# Patient Record
Sex: Female | Born: 1980
Health system: Southern US, Community
[De-identification: ages and names within clinical notes are randomized; demographics above are authoritative.]

## PROBLEM LIST (undated history)

## (undated) DIAGNOSIS — F419 Anxiety disorder, unspecified: Secondary | ICD-10-CM

## (undated) HISTORY — PX: WISDOM TOOTH EXTRACTION: SHX21

## (undated) HISTORY — DX: Anxiety disorder, unspecified: F41.9

---

## 2009-06-15 HISTORY — PX: APPENDECTOMY: SHX54

## 2012-06-15 LAB — HM PAP SMEAR: HM Pap smear: NORMAL

## 2014-10-09 ENCOUNTER — Encounter: Payer: Self-pay | Admitting: *Deleted

## 2014-10-09 ENCOUNTER — Telehealth: Payer: Self-pay | Admitting: *Deleted

## 2014-10-09 NOTE — Telephone Encounter (Signed)
Pre-Visit Call completed with patient and chart updated.   Pre-Visit Info documented in Specialty Comments under [employer].    

## 2014-10-10 ENCOUNTER — Encounter: Payer: Self-pay | Admitting: Physician Assistant

## 2014-10-10 ENCOUNTER — Ambulatory Visit (INDEPENDENT_AMBULATORY_CARE_PROVIDER_SITE_OTHER): Payer: 59 | Admitting: Physician Assistant

## 2014-10-10 VITALS — BP 106/62 | HR 72 | Temp 98.5°F | Resp 16 | Ht 64.0 in | Wt 143.0 lb

## 2014-10-10 DIAGNOSIS — K5900 Constipation, unspecified: Secondary | ICD-10-CM

## 2014-10-10 DIAGNOSIS — F329 Major depressive disorder, single episode, unspecified: Secondary | ICD-10-CM | POA: Insufficient documentation

## 2014-10-10 DIAGNOSIS — F411 Generalized anxiety disorder: Secondary | ICD-10-CM | POA: Diagnosis not present

## 2014-10-10 DIAGNOSIS — F32A Depression, unspecified: Secondary | ICD-10-CM | POA: Insufficient documentation

## 2014-10-10 DIAGNOSIS — K5904 Chronic idiopathic constipation: Secondary | ICD-10-CM

## 2014-10-10 DIAGNOSIS — F419 Anxiety disorder, unspecified: Secondary | ICD-10-CM

## 2014-10-10 MED ORDER — CITALOPRAM HYDROBROMIDE 10 MG PO TABS: 10.0000 mg | ORAL_TABLET | Freq: Every day | ORAL | Status: DC

## 2014-10-10 MED ORDER — LORAZEPAM 0.5 MG PO TABS: 0.5000 mg | ORAL_TABLET | Freq: Two times a day (BID) | ORAL | Status: DC

## 2014-10-10 MED ORDER — LINACLOTIDE 145 MCG PO CAPS: 145.0000 ug | ORAL_CAPSULE | Freq: Every day | ORAL | Status: DC

## 2014-10-10 NOTE — Patient Instructions (Signed)
Please continue your medications as directed. Stay well hydrated and continue Probiotic. Take the Linzess as directed (using Voucher given) for chronic constipation. Follow-up in 1 month.

## 2014-10-10 NOTE — Assessment & Plan Note (Signed)
Doing well overall.  Medications refilled.  Will follow-up in 3 months.

## 2014-10-10 NOTE — Assessment & Plan Note (Addendum)
Patient has tried probiotics, fiber, hydration, exercise and OTC laxatives with only minimal improvement. Will begin trial of Linzess. Follow-up in 1 month.

## 2014-10-10 NOTE — Progress Notes (Signed)
   Patient presents to clinic today to establish care.  Acute Concerns: No acute concerns today.  Chronic Issues: Anxiety --  Currently on Citalopram 10 mg and Ativan BID. Endorses doing well overall.  Denies SI/HI or panic attack.  Denies depressed mood.  Chronic Constipation -- Endorses history of chronic constipation since youth.  Endorses BM every 4-5 days.  Is taking a probiotic and high fiber/water diet without improvement in symptoms.  Denies tenesmus, melena, or hematochezia.  Past Medical History  Diagnosis Date  . Anxiety     Past Surgical History  Procedure Laterality Date  . Appendectomy  2011  . Wisdom tooth extraction      Current Outpatient Prescriptions on File Prior to Visit  Medication Sig Dispense Refill  . Multiple Vitamins-Minerals (MULTIVITAMIN ADULT PO) Take 1 tablet by mouth daily. Women's Health    . Probiotic Product (PROBIOTIC DAILY PO) Take 2 capsules by mouth daily.     No current facility-administered medications on file prior to visit.    Allergies  Allergen Reactions  . Latex Rash    Family History  Problem Relation Age of Onset  . Fibromyalgia Father   . Hypertension Maternal Grandmother     History   Social History  . Marital Status: Married    Spouse Name: N/A  . Number of Children: N/A  . Years of Education: N/A   Occupational History  . Not on file.   Social History Main Topics  . Smoking status: Never Smoker   . Smokeless tobacco: Not on file  . Alcohol Use: 0.0 oz/week    0 Standard drinks or equivalent per week  . Drug Use: No  . Sexual Activity: Not on file   Other Topics Concern  . Not on file   Social History Narrative   ROS Pertinent ROS are listed in the HPI.  BP 106/62 mmHg  Pulse 72  Temp(Src) 98.5 F (36.9 C) (Oral)  Resp 16  Ht 5\' 4"  (1.626 m)  Wt 143 lb (64.864 kg)  BMI 24.53 kg/m2  SpO2 98%  LMP 09/26/2014  Physical Exam  Constitutional: She is oriented to person, place, and time and  well-developed, well-nourished, and in no distress.  HENT:  Head: Normocephalic and atraumatic.  Eyes: Conjunctivae are normal.  Neck: Neck supple.  Cardiovascular: Normal rate, regular rhythm, normal heart sounds and intact distal pulses.   Pulmonary/Chest: Effort normal and breath sounds normal. No respiratory distress. She has no wheezes. She has no rales. She exhibits no tenderness.  Abdominal: Soft. Bowel sounds are normal. She exhibits no distension and no mass. There is no tenderness. There is no rebound and no guarding.  Neurological: She is alert and oriented to person, place, and time.  Skin: Skin is warm and dry. No rash noted.  Psychiatric: Affect normal.     Assessment/Plan: Generalized anxiety disorder Doing well overall.  Medications refilled.  Will follow-up in 3 months.   Chronic idiopathic constipation Patient has tried probiotics, fiber, hydration, exercise and OTC laxatives with only minimal improvement. Will begin trial of Linzess. Follow-up in 1 month.

## 2014-10-10 NOTE — Progress Notes (Signed)
Pre visit review using our clinic review tool, if applicable. No additional management support is needed unless otherwise documented below in the visit note. 

## 2014-10-15 ENCOUNTER — Telehealth: Payer: Self-pay | Admitting: Physician Assistant

## 2014-10-15 NOTE — Telephone Encounter (Signed)
No need to follow-up at present. I will keep a look out for coupons for Amitiza as we previously had free trial vouchers for that medication.  I will call her when we get some more.

## 2014-10-15 NOTE — Telephone Encounter (Signed)
LVM advising pt of PA instructions  °

## 2014-10-15 NOTE — Telephone Encounter (Signed)
Relation to pt: self Call back number: [phone]   Reason for call:  Pt states Linaclotide (LINZESS) 145 MCG CAPS capsule  Is to expensive and was unable to use coupon. Pt also states she has had this problem all her life regarding eating and wanted to know if you still wanted her to follow up 11/07/14 due to her not taking medication. Please advise

## 2014-11-07 ENCOUNTER — Ambulatory Visit (INDEPENDENT_AMBULATORY_CARE_PROVIDER_SITE_OTHER): Payer: 59 | Admitting: Physician Assistant

## 2014-11-07 NOTE — Progress Notes (Signed)
Opened in error

## 2014-11-07 NOTE — Progress Notes (Deleted)
    Patient presents to clinic today for 1457-month follow-up of generalized anxiety disorder after beginning treatment with Celexa 10 mg daily and Ativan 0.5 mg PRN. Patient endorses ***.  ***.  Denies ***.  Past Medical History  Diagnosis Date  . Anxiety     Current Outpatient Prescriptions on File Prior to Visit  Medication Sig Dispense Refill  . citalopram (CELEXA) 10 MG tablet Take 1 tablet (10 mg total) by mouth daily. 90 tablet 1  . Linaclotide (LINZESS) 145 MCG CAPS capsule Take 1 capsule (145 mcg total) by mouth daily. 30 capsule 1  . LORazepam (ATIVAN) 0.5 MG tablet Take 1 tablet (0.5 mg total) by mouth 2 (two) times daily. 30 tablet 2  . Multiple Vitamins-Minerals (MULTIVITAMIN ADULT PO) Take 1 tablet by mouth daily. Women's Health    . Probiotic Product (PROBIOTIC DAILY PO) Take 2 capsules by mouth daily.     No current facility-administered medications on file prior to visit.    Allergies  Allergen Reactions  . Latex Rash    Family History  Problem Relation Age of Onset  . Fibromyalgia Father   . Hypertension Maternal Grandmother     History   Social History  . Marital Status: Married    Spouse Name: N/A  . Number of Children: N/A  . Years of Education: N/A   Social History Main Topics  . Smoking status: Never Smoker   . Smokeless tobacco: Not on file  . Alcohol Use: 0.0 oz/week    0 Standard drinks or equivalent per week  . Drug Use: No  . Sexual Activity: Not on file   Other Topics Concern  . Not on file   Social History Narrative   Review of Systems - See HPI.  All other ROS are negative.  LMP 09/26/2014  Physical Exam  No results found for this or any previous visit (from the past 2160 hour(s)).  Assessment/Plan: No problem-specific assessment & plan notes found for this encounter.

## 2014-11-07 NOTE — Progress Notes (Deleted)
Pre visit review using our clinic review tool, if applicable. No additional management support is needed unless otherwise documented below in the visit note. 

## 2015-01-27 ENCOUNTER — Other Ambulatory Visit: Payer: Self-pay | Admitting: Physician Assistant

## 2015-03-11 ENCOUNTER — Telehealth: Payer: Self-pay | Admitting: Physician Assistant

## 2015-03-11 NOTE — Telephone Encounter (Signed)
Relation to NF:AOZH Call back number: [phone] Pharmacy:  [id number] - [place], [place] - [address] AT [facility] [address] [phone] (Phone) [phone] (Fax)         Reason for call:  Patient requesting MG increase due to the fact she doesn't see any results. Advised patient she would need an appointment patient states she has a scheduled physical in October. Advised patient i would inform PA of her concerns. Please advise

## 2015-03-11 NOTE — Telephone Encounter (Signed)
Increase to 2 tablets (20 mg of current prescription) as she should have a supply at home to last until her physical. We will address further at visit.

## 2015-03-11 NOTE — Telephone Encounter (Signed)
Spoke with patient personally. Will increase to 20 mg daily over next 1-2 weeks and let us know how she is doing. Is having financial issues presently and cannot afford to pay OOP at the moment for a visit.

## 2015-03-26 ENCOUNTER — Encounter: Payer: Self-pay | Admitting: Physician Assistant

## 2015-03-26 ENCOUNTER — Ambulatory Visit (INDEPENDENT_AMBULATORY_CARE_PROVIDER_SITE_OTHER): Payer: 59 | Admitting: Physician Assistant

## 2015-03-26 ENCOUNTER — Encounter (INDEPENDENT_AMBULATORY_CARE_PROVIDER_SITE_OTHER): Payer: Self-pay

## 2015-03-26 VITALS — BP 99/62 | HR 72 | Temp 98.3°F | Resp 16 | Ht 64.0 in | Wt 148.0 lb

## 2015-03-26 DIAGNOSIS — R399 Unspecified symptoms and signs involving the genitourinary system: Secondary | ICD-10-CM | POA: Diagnosis not present

## 2015-03-26 DIAGNOSIS — N390 Urinary tract infection, site not specified: Secondary | ICD-10-CM | POA: Diagnosis not present

## 2015-03-26 DIAGNOSIS — R82998 Other abnormal findings in urine: Secondary | ICD-10-CM

## 2015-03-26 DIAGNOSIS — Z0000 Encounter for general adult medical examination without abnormal findings: Secondary | ICD-10-CM

## 2015-03-26 DIAGNOSIS — F411 Generalized anxiety disorder: Secondary | ICD-10-CM | POA: Diagnosis not present

## 2015-03-26 LAB — HEPATIC FUNCTION PANEL
ALK PHOS: 71 U/L (ref 39–117)
ALT: 12 U/L (ref 0–35)
AST: 17 U/L (ref 0–37)
Albumin: 4.4 g/dL (ref 3.5–5.2)
BILIRUBIN DIRECT: 0.1 mg/dL (ref 0.0–0.3)
BILIRUBIN TOTAL: 0.6 mg/dL (ref 0.2–1.2)
Total Protein: 7.6 g/dL (ref 6.0–8.3)

## 2015-03-26 LAB — POCT URINALYSIS DIPSTICK
Bilirubin, UA: NEGATIVE
Blood, UA: NEGATIVE
Glucose, UA: NEGATIVE
Ketones, UA: NEGATIVE
Nitrite, UA: NEGATIVE
PROTEIN UA: NEGATIVE
Spec Grav, UA: >=1.03
UROBILINOGEN UA: 0.2
pH, UA: 6

## 2015-03-26 LAB — HEMOGLOBIN A1C: Hgb A1c MFr Bld: 5.6 % (ref 4.6–6.5)

## 2015-03-26 LAB — CBC
HCT: 40.1 % (ref 36.0–46.0)
Hemoglobin: 13.3 g/dL (ref 12.0–15.0)
MCHC: 33.3 g/dL (ref 30.0–36.0)
MCV: 92.1 fl (ref 78.0–100.0)
PLATELETS: 228 10*3/uL (ref 150.0–400.0)
RBC: 4.35 Mil/uL (ref 3.87–5.11)
RDW: 13.8 % (ref 11.5–15.5)
WBC: 6 10*3/uL (ref 4.0–10.5)

## 2015-03-26 LAB — BASIC METABOLIC PANEL
BUN: 10 mg/dL (ref 6–23)
CHLORIDE: 104 meq/L (ref 96–112)
CO2: 28 mEq/L (ref 19–32)
Calcium: 9.8 mg/dL (ref 8.4–10.5)
Creatinine, Ser: 0.86 mg/dL (ref 0.40–1.20)
GFR: 79.94 mL/min (ref >60.00)
GLUCOSE: 83 mg/dL (ref 70–99)
POTASSIUM: 4 meq/L (ref 3.5–5.1)
SODIUM: 139 meq/L (ref 135–145)

## 2015-03-26 LAB — LIPID PANEL
CHOL/HDL RATIO: 4
Cholesterol: 213 mg/dL — ABNORMAL HIGH (ref 0–200)
HDL: 57.6 mg/dL (ref >39.00)
LDL CALC: 140 mg/dL — AB (ref 0–99)
NONHDL: 155.13
TRIGLYCERIDES: 75 mg/dL (ref 0.0–149.0)
VLDL: 15 mg/dL (ref 0.0–40.0)

## 2015-03-26 LAB — TSH: TSH: 1.84 u[IU]/mL (ref 0.35–4.50)

## 2015-03-26 MED ORDER — SERTRALINE HCL 50 MG PO TABS: 50.0000 mg | ORAL_TABLET | Freq: Every day | ORAL | Status: DC

## 2015-03-26 MED ORDER — NITROFURANTOIN MONOHYD MACRO 100 MG PO CAPS: 100.0000 mg | ORAL_CAPSULE | Freq: Two times a day (BID) | ORAL | Status: DC

## 2015-03-26 MED ORDER — LORAZEPAM 0.5 MG PO TABS: 0.5000 mg | ORAL_TABLET | Freq: Two times a day (BID) | ORAL | Status: DC

## 2015-03-26 NOTE — Progress Notes (Signed)
Patient presents to clinic today for annual exam.  Patient is fasting for labs.  Acute Concerns: Patient c/o 2 weeks of suprapubic pressure, urinary urgency and dysuria. Denies fever, chills, nausea/vomiting or back pain.   Chronic Issues: Anxiety -- Citalopram sub-therapeutic at 20 mg. Patient endorses she feels fatigue in addition to anxious and depressed. Is not willing to consider counseling. Denies SI/HI.  Health Maintenance: Dental -- up-to-date Vision -- up-to-date Immunizations -- Tetanus in 08 per patient. Declines flu shot. PAP -- Last in 2014. Denies abnormal PAP. Followed by GYN but needs new specialist.  Past Medical History  Diagnosis Date  . Anxiety     Past Surgical History  Procedure Laterality Date  . Appendectomy  2011  . Wisdom tooth extraction      Current Outpatient Prescriptions on File Prior to Visit  Medication Sig Dispense Refill  . Multiple Vitamins-Minerals (MULTIVITAMIN ADULT PO) Take 1 tablet by mouth daily. Women's Health    . Probiotic Product (PROBIOTIC DAILY PO) Take 2 capsules by mouth daily.     No current facility-administered medications on file prior to visit.    Allergies  Allergen Reactions  . Latex Rash    Family History  Problem Relation Age of Onset  . Fibromyalgia Father   . Hypertension Maternal Grandmother     Social History   Social History  . Marital Status: Married    Spouse Name: N/A  . Number of Children: N/A  . Years of Education: N/A   Occupational History  . Not on file.   Social History Main Topics  . Smoking status: Former Games developer  . Smokeless tobacco: Never Used  . Alcohol Use: 0.0 oz/week    0 Standard drinks or equivalent per week  . Drug Use: No  . Sexual Activity:    Partners: Male   Other Topics Concern  . Not on file   Social History Narrative   Review of Systems  Constitutional: Negative for fever and weight loss.  HENT: Negative for ear discharge, ear pain, hearing loss and  tinnitus.   Eyes: Negative for blurred vision, double vision, photophobia and pain.  Respiratory: Negative for cough and shortness of breath.   Cardiovascular: Negative for chest pain and palpitations.  Gastrointestinal: Negative for heartburn, nausea, vomiting, abdominal pain, diarrhea, constipation, blood in stool and melena.  Genitourinary: Negative for dysuria, urgency, frequency, hematuria and flank pain.  Musculoskeletal: Negative for falls.  Neurological: Negative for dizziness, loss of consciousness and headaches.  Endo/Heme/Allergies: Negative for environmental allergies.  Psychiatric/Behavioral: Negative for depression, suicidal ideas, hallucinations and substance abuse. The patient is nervous/anxious. The patient does not have insomnia.    BP 99/62 mmHg  Pulse 72  Temp(Src) 98.3 F (36.8 C) (Oral)  Resp 16  Ht  (1.626 m)  Wt 148 lb (67.132 kg)  BMI 25.39 kg/m2  SpO2 100%  LMP 03/12/2015  Physical Exam  Constitutional: She is oriented to person, place, and time and well-developed, well-nourished, and in no distress.  HENT:  Head: Normocephalic and atraumatic.  Right Ear: Tympanic membrane, external ear and ear canal normal.  Left Ear: Tympanic membrane, external ear and ear canal normal.  Nose: Nose normal. No mucosal edema.  Mouth/Throat: Uvula is midline, oropharynx is clear and moist and mucous membranes are normal. No oropharyngeal exudate or posterior oropharyngeal erythema.  Eyes: Conjunctivae are normal. Pupils are equal, round, and reactive to light.  Neck: Neck supple. No thyromegaly present.  Cardiovascular: Normal rate, regular rhythm,  normal heart sounds and intact distal pulses.   Pulmonary/Chest: Effort normal and breath sounds normal. No respiratory distress. She has no wheezes. She has no rales.  Abdominal: Soft. Bowel sounds are normal. She exhibits no distension and no mass. There is no tenderness. There is no rebound and no guarding.    Lymphadenopathy:    She has no cervical adenopathy.  Neurological: She is alert and oriented to person, place, and time. No cranial nerve deficit.  Skin: Skin is warm and dry. No rash noted.  Psychiatric: Affect normal.  Vitals reviewed.   Recent Results (from the past 2160 hour(s))  POCT urinalysis dipstick     Status: Abnormal   Collection Time: 03/26/15  8:48 AM  Result Value Ref Range   Color, UA gold    Clarity, UA cloudy    Glucose, UA neg    Bilirubin, UA neg    Ketones, UA neg    Spec Grav, UA >=1.030    Blood, UA neg    pH, UA 6.0    Protein, UA neg    Urobilinogen, UA 0.2    Nitrite, UA neg    Leukocytes, UA small (1+) (A) Negative  CBC     Status: None   Collection Time: 03/26/15  9:31 AM  Result Value Ref Range   WBC 6.0 4.0 - 10.5 K/uL   RBC 4.35 3.87 - 5.11 Mil/uL   Platelets 228.0 150.0 - 400.0 K/uL   Hemoglobin 13.3 12.0 - 15.0 g/dL   HCT 84.6 96.2 - 95.2 %   MCV 92.1 78.0 - 100.0 fl   MCHC 33.3 30.0 - 36.0 g/dL   RDW 84.1 32.4 - 40.1 %  Basic metabolic panel     Status: None   Collection Time: 03/26/15  9:31 AM  Result Value Ref Range   Sodium 139 135 - 145 mEq/L   Potassium 4.0 3.5 - 5.1 mEq/L   Chloride 104 96 - 112 mEq/L   CO2 28 19 - 32 mEq/L   Glucose, Bld 83 70 - 99 mg/dL   BUN 10 6 - 23 mg/dL   Creatinine, Ser 0.27 0.40 - 1.20 mg/dL   Calcium 9.8 8.4 - 25.3 mg/dL   GFR 66.44 >03.47 mL/min  TSH     Status: None   Collection Time: 03/26/15  9:31 AM  Result Value Ref Range   TSH 1.84 0.35 - 4.50 uIU/mL  Hemoglobin A1c     Status: None   Collection Time: 03/26/15  9:31 AM  Result Value Ref Range   Hgb A1c MFr Bld 5.6 4.6 - 6.5 %    Comment: Glycemic Control Guidelines for People with Diabetes:Non Diabetic:  <6%Goal of Therapy: <7%Additional Action Suggested:  >8%   Hepatic function panel     Status: None   Collection Time: 03/26/15  9:31 AM  Result Value Ref Range   Total Bilirubin 0.6 0.2 - 1.2 mg/dL   Bilirubin, Direct 0.1 0.0 - 0.3  mg/dL   Alkaline Phosphatase 71 39 - 117 U/L   AST 17 0 - 37 U/L   ALT 12 0 - 35 U/L   Total Protein 7.6 6.0 - 8.3 g/dL   Albumin 4.4 3.5 - 5.2 g/dL  Lipid panel     Status: Abnormal   Collection Time: 03/26/15  9:31 AM  Result Value Ref Range   Cholesterol 213 (H) 0 - 200 mg/dL    Comment: ATP III Classification       Desirable:  < 200  mg/dL               Borderline High:  200 - 239 mg/dL          High:  > = 409 mg/dL   Triglycerides 81.1 0.0 - 149.0 mg/dL    Comment: Normal:  <914 mg/dLBorderline High:  150 - 199 mg/dL   HDL 78.29 >56.21 mg/dL   VLDL 30.8 0.0 - 65.7 mg/dL   LDL Cholesterol 846 (H) 0 - 99 mg/dL   Total CHOL/HDL Ratio 4     Comment:                Men          Women1/2 Average Risk     3.4          3.3Average Risk          5.0          4.42X Average Risk          9.6          7.13X Average Risk          15.0          11.0                       NonHDL 155.13     Comment: NOTE:  Non-HDL goal should be 30 mg/dL higher than patient's LDL goal (i.e. LDL goal of < 70 mg/dL, would have non-HDL goal of < 100 mg/dL)    Assessment/Plan: Visit for preventive health examination Depression/Anxiety screen positive -- Addressed this visit (see A/P) Health Maintenance reviewed -- Declines flu shot. Referral to GYN placed for PAP per patient request. Preventive schedule discussed and handout given in AVS. Will obtain fasting labs today.   UTI symptoms Urine dip with + LE. Will start Macrobid while sending urine for culture. Increase fluids and start Probiotic. Will alter regimen based on results.  Generalized anxiety disorder Handout on [facility] counselors given to patient. Will stop Celexa and begin Zoloft. Continue Ativan. Follow-up 1 month.

## 2015-03-26 NOTE — Patient Instructions (Signed)
Please go to the lab for blood work. I will call with results.  Please stop the Citalopram and begin the Sertraline daily as directed. Continue other medications.  Follow-up in 1 month.  Preventive Care for Adults, Female A healthy lifestyle and preventive care can promote health and wellness. Preventive health guidelines for women include the following key practices.  A routine yearly physical is a good way to check with your health care provider about your health and preventive screening. It is a chance to share any concerns and updates on your health and to receive a thorough exam.  Visit your dentist for a routine exam and preventive care every 6 months. Brush your teeth twice a day and floss once a day. Good oral hygiene prevents tooth decay and gum disease.  The frequency of eye exams is based on your age, health, family medical history, use of contact lenses, and other factors. Follow your health care provider's recommendations for frequency of eye exams.  Eat a healthy diet. Foods like vegetables, fruits, whole grains, low-fat dairy products, and lean protein foods contain the nutrients you need without too many calories. Decrease your intake of foods high in solid fats, added sugars, and salt. Eat the right amount of calories for you.Get information about a proper diet from your health care provider, if necessary.  Regular physical exercise is one of the most important things you can do for your health. Most adults should get at least 150 minutes of moderate-intensity exercise (any activity that increases your heart rate and causes you to sweat) each week. In addition, most adults need muscle-strengthening exercises on 2 or more days a week.  Maintain a healthy weight. The body mass index (BMI) is a screening tool to identify possible weight problems. It provides an estimate of body fat based on height and weight. Your health care provider can find your BMI and can help you achieve or  maintain a healthy weight.For adults 20 years and older:  A BMI below 18.5 is considered underweight.  A BMI of 18.5 to 24.9 is normal.  A BMI of 25 to 29.9 is considered overweight.  A BMI of 30 and above is considered obese.  Maintain normal blood lipids and cholesterol levels by exercising and minimizing your intake of saturated fat. Eat a balanced diet with plenty of fruit and vegetables. Blood tests for lipids and cholesterol should begin at age 25 and be repeated every 5 years. If your lipid or cholesterol levels are high, you are over 50, or you are at high risk for heart disease, you may need your cholesterol levels checked more frequently.Ongoing high lipid and cholesterol levels should be treated with medicines if diet and exercise are not working.  If you smoke, find out from your health care provider how to quit. If you do not use tobacco, do not start.  Lung cancer screening is recommended for adults aged 14-80 years who are at high risk for developing lung cancer because of a history of smoking. A yearly low-dose CT scan of the lungs is recommended for people who have at least a 30-pack-year history of smoking and are a current smoker or have quit within the past 15 years. A pack year of smoking is smoking an average of 1 pack of cigarettes a day for 1 year (for example: 1 pack a day for 30 years or 2 packs a day for 15 years). Yearly screening should continue until the smoker has stopped smoking for at least 15  years. Yearly screening should be stopped for people who develop a health problem that would prevent them from having lung cancer treatment.  If you are pregnant, do not drink alcohol. If you are breastfeeding, be very cautious about drinking alcohol. If you are not pregnant and choose to drink alcohol, do not have more than 1 drink per day. One drink is considered to be 12 ounces (355 mL) of beer, 5 ounces (148 mL) of wine, or 1.5 ounces (44 mL) of liquor.  Avoid use of  street drugs. Do not share needles with anyone. Ask for help if you need support or instructions about stopping the use of drugs.  High blood pressure causes heart disease and increases the risk of stroke. Your blood pressure should be checked at least every 1 to 2 years. Ongoing high blood pressure should be treated with medicines if weight loss and exercise do not work.  If you are 35-81 years old, ask your health care provider if you should take aspirin to prevent strokes.  Diabetes screening is done by taking a blood sample to check your blood glucose level after you have not eaten for a certain period of time (fasting). If you are not overweight and you do not have risk factors for diabetes, you should be screened once every 3 years starting at age 28. If you are overweight or obese and you are 8-72 years of age, you should be screened for diabetes every year as part of your cardiovascular risk assessment.  Breast cancer screening is essential preventive care for women. You should practice "breast self-awareness." This means understanding the normal appearance and feel of your breasts and may include breast self-examination. Any changes detected, no matter how small, should be reported to a health care provider. Women in their 63s and 30s should have a clinical breast exam (CBE) by a health care provider as part of a regular health exam every 1 to 3 years. After age 29, women should have a CBE every year. Starting at age 54, women should consider having a mammogram (breast X-ray test) every year. Women who have a family history of breast cancer should talk to their health care provider about genetic screening. Women at a high risk of breast cancer should talk to their health care providers about having an MRI and a mammogram every year.  Breast cancer gene (BRCA)-related cancer risk assessment is recommended for women who have family members with BRCA-related cancers. BRCA-related cancers include  breast, ovarian, tubal, and peritoneal cancers. Having family members with these cancers may be associated with an increased risk for harmful changes (mutations) in the breast cancer genes BRCA1 and BRCA2. Results of the assessment will determine the need for genetic counseling and BRCA1 and BRCA2 testing.  Your health care provider may recommend that you be screened regularly for cancer of the pelvic organs (ovaries, uterus, and vagina). This screening involves a pelvic examination, including checking for microscopic changes to the surface of your cervix (Pap test). You may be encouraged to have this screening done every 3 years, beginning at age 63.  For women ages 31-65, health care providers may recommend pelvic exams and Pap testing every 3 years, or they may recommend the Pap and pelvic exam, combined with testing for human papilloma virus (HPV), every 5 years. Some types of HPV increase your risk of cervical cancer. Testing for HPV may also be done on women of any age with unclear Pap test results.  Other health care providers may  not recommend any screening for nonpregnant women who are considered low risk for pelvic cancer and who do not have symptoms. Ask your health care provider if a screening pelvic exam is right for you.  If you have had past treatment for cervical cancer or a condition that could lead to cancer, you need Pap tests and screening for cancer for at least 20 years after your treatment. If Pap tests have been discontinued, your risk factors (such as having a new sexual partner) need to be reassessed to determine if screening should resume. Some women have medical problems that increase the chance of getting cervical cancer. In these cases, your health care provider may recommend more frequent screening and Pap tests.  Colorectal cancer can be detected and often prevented. Most routine colorectal cancer screening begins at the age of 46 years and continues through age 53 years.  However, your health care provider may recommend screening at an earlier age if you have risk factors for colon cancer. On a yearly basis, your health care provider may provide home test kits to check for hidden blood in the stool. Use of a small camera at the end of a tube, to directly examine the colon (sigmoidoscopy or colonoscopy), can detect the earliest forms of colorectal cancer. Talk to your health care provider about this at age 84, when routine screening begins. Direct exam of the colon should be repeated every 5-10 years through age 73 years, unless early forms of precancerous polyps or small growths are found.  People who are at an increased risk for hepatitis B should be screened for this virus. You are considered at high risk for hepatitis B if:  You were born in a country where hepatitis B occurs often. Talk with your health care provider about which countries are considered high risk.  Your parents were born in a high-risk country and you have not received a shot to protect against hepatitis B (hepatitis B vaccine).  You have HIV or AIDS.  You use needles to inject street drugs.  You live with, or have sex with, someone who has hepatitis B.  You get hemodialysis treatment.  You take certain medicines for conditions like cancer, organ transplantation, and autoimmune conditions.  Hepatitis C blood testing is recommended for all people born from [birth year] through 1965 and any individual with known risks for hepatitis C.  Practice safe sex. Use condoms and avoid high-risk sexual practices to reduce the spread of sexually transmitted infections (STIs). STIs include gonorrhea, chlamydia, syphilis, trichomonas, herpes, HPV, and human immunodeficiency virus (HIV). Herpes, HIV, and HPV are viral illnesses that have no cure. They can result in disability, cancer, and death.  You should be screened for sexually transmitted illnesses (STIs) including gonorrhea and chlamydia if:  You are  sexually active and are younger than 24 years.  You are older than 24 years and your health care provider tells you that you are at risk for this type of infection.  Your sexual activity has changed since you were last screened and you are at an increased risk for chlamydia or gonorrhea. Ask your health care provider if you are at risk.  If you are at risk of being infected with HIV, it is recommended that you take a prescription medicine daily to prevent HIV infection. This is called preexposure prophylaxis (PrEP). You are considered at risk if:  You are sexually active and do not regularly use condoms or know the HIV status of your partner(s).  You take  drugs by injection.  You are sexually active with a partner who has HIV.  Talk with your health care provider about whether you are at high risk of being infected with HIV. If you choose to begin PrEP, you should first be tested for HIV. You should then be tested every 3 months for as long as you are taking PrEP.  Osteoporosis is a disease in which the bones lose minerals and strength with aging. This can result in serious bone fractures or breaks. The risk of osteoporosis can be identified using a bone density scan. Women ages 71 years and over and women at risk for fractures or osteoporosis should discuss screening with their health care providers. Ask your health care provider whether you should take a calcium supplement or vitamin D to reduce the rate of osteoporosis.  Menopause can be associated with physical symptoms and risks. Hormone replacement therapy is available to decrease symptoms and risks. You should talk to your health care provider about whether hormone replacement therapy is right for you.  Use sunscreen. Apply sunscreen liberally and repeatedly throughout the day. You should seek shade when your shadow is shorter than you. Protect yourself by wearing long sleeves, pants, a wide-brimmed hat, and sunglasses year round, whenever  you are outdoors.  Once a month, do a whole body skin exam, using a mirror to look at the skin on your back. Tell your health care provider of new moles, moles that have irregular borders, moles that are larger than a pencil eraser, or moles that have changed in shape or color.  Stay current with required vaccines (immunizations).  Influenza vaccine. All adults should be immunized every year.  Tetanus, diphtheria, and acellular pertussis (Td, Tdap) vaccine. Pregnant women should receive 1 dose of Tdap vaccine during each pregnancy. The dose should be obtained regardless of the length of time since the last dose. Immunization is preferred during the 27th-36th week of gestation. An adult who has not previously received Tdap or who does not know her vaccine status should receive 1 dose of Tdap. This initial dose should be followed by tetanus and diphtheria toxoids (Td) booster doses every 10 years. Adults with an unknown or incomplete history of completing a 3-dose immunization series with Td-containing vaccines should begin or complete a primary immunization series including a Tdap dose. Adults should receive a Td booster every 10 years.  Varicella vaccine. An adult without evidence of immunity to varicella should receive 2 doses or a second dose if she has previously received 1 dose. Pregnant females who do not have evidence of immunity should receive the first dose after pregnancy. This first dose should be obtained before leaving the health care facility. The second dose should be obtained 4-8 weeks after the first dose.  Human papillomavirus (HPV) vaccine. Females aged 13-26 years who have not received the vaccine previously should obtain the 3-dose series. The vaccine is not recommended for use in pregnant females. However, pregnancy testing is not needed before receiving a dose. If a female is found to be pregnant after receiving a dose, no treatment is needed. In that case, the remaining doses  should be delayed until after the pregnancy. Immunization is recommended for any person with an immunocompromised condition through the age of 49 years if she did not get any or all doses earlier. During the 3-dose series, the second dose should be obtained 4-8 weeks after the first dose. The third dose should be obtained 24 weeks after the first dose and  16 weeks after the second dose.  Zoster vaccine. One dose is recommended for adults aged 43 years or older unless certain conditions are present.  Measles, mumps, and rubella (MMR) vaccine. Adults born before [birth year] generally are considered immune to measles and mumps. Adults born in [birth year] or later should have 1 or more doses of MMR vaccine unless there is a contraindication to the vaccine or there is laboratory evidence of immunity to each of the three diseases. A routine second dose of MMR vaccine should be obtained at least 28 days after the first dose for students attending postsecondary schools, health care workers, or international travelers. People who received inactivated measles vaccine or an unknown type of measles vaccine during 1963-1967 should receive 2 doses of MMR vaccine. People who received inactivated mumps vaccine or an unknown type of mumps vaccine before 1979 and are at high risk for mumps infection should consider immunization with 2 doses of MMR vaccine. For females of childbearing age, rubella immunity should be determined. If there is no evidence of immunity, females who are not pregnant should be vaccinated. If there is no evidence of immunity, females who are pregnant should delay immunization until after pregnancy. Unvaccinated health care workers born before [birth year] who lack laboratory evidence of measles, mumps, or rubella immunity or laboratory confirmation of disease should consider measles and mumps immunization with 2 doses of MMR vaccine or rubella immunization with 1 dose of MMR vaccine.  Pneumococcal 13-valent conjugate (PCV13)  vaccine. When indicated, a person who is uncertain of his immunization history and has no record of immunization should receive the PCV13 vaccine. All adults 77 years of age and older should receive this vaccine. An adult aged 45 years or older who has certain medical conditions and has not been previously immunized should receive 1 dose of PCV13 vaccine. This PCV13 should be followed with a dose of pneumococcal polysaccharide (PPSV23) vaccine. Adults who are at high risk for pneumococcal disease should obtain the PPSV23 vaccine at least 8 weeks after the dose of PCV13 vaccine. Adults older than 34 years of age who have normal immune system function should obtain the PPSV23 vaccine dose at least 1 year after the dose of PCV13 vaccine.  Pneumococcal polysaccharide (PPSV23) vaccine. When PCV13 is also indicated, PCV13 should be obtained first. All adults aged 67 years and older should be immunized. An adult younger than age 67 years who has certain medical conditions should be immunized. Any person who resides in a nursing home or long-term care facility should be immunized. An adult smoker should be immunized. People with an immunocompromised condition and certain other conditions should receive both PCV13 and PPSV23 vaccines. People with human immunodeficiency virus (HIV) infection should be immunized as soon as possible after diagnosis. Immunization during chemotherapy or radiation therapy should be avoided. Routine use of PPSV23 vaccine is not recommended for American Indians, [place]atives, or people younger than 65 years unless there are medical conditions that require PPSV23 vaccine. When indicated, people who have unknown immunization and have no record of immunization should receive PPSV23 vaccine. One-time revaccination 5 years after the first dose of PPSV23 is recommended for people aged 19-64 years who have chronic kidney failure, nephrotic syndrome, asplenia, or immunocompromised conditions. People who  received 1-2 doses of PPSV23 before age 33 years should receive another dose of PPSV23 vaccine at age 69 years or later if at least 5 years have passed since the previous dose. Doses of PPSV23 are not needed for people immunized  with PPSV23 at or after age 71 years.  Meningococcal vaccine. Adults with asplenia or persistent complement component deficiencies should receive 2 doses of quadrivalent meningococcal conjugate (MenACWY-D) vaccine. The doses should be obtained at least 2 months apart. Microbiologists working with certain meningococcal bacteria, [employer] recruits, people at risk during an outbreak, and people who travel to or live in countries with a high rate of meningitis should be immunized. A first-year college student up through age 3 years who is living in a residence hall should receive a dose if she did not receive a dose on or after her 16th birthday. Adults who have certain high-risk conditions should receive one or more doses of vaccine.  Hepatitis A vaccine. Adults who wish to be protected from this disease, have certain high-risk conditions, work with hepatitis A-infected animals, work in hepatitis A research labs, or travel to or work in countries with a high rate of hepatitis A should be immunized. Adults who were previously unvaccinated and who anticipate close contact with an international adoptee during the first 60 days after arrival in the [place] from a country with a high rate of hepatitis A should be immunized.  Hepatitis B vaccine. Adults who wish to be protected from this disease, have certain high-risk conditions, may be exposed to blood or other infectious body fluids, are household contacts or sex partners of hepatitis B positive people, are clients or workers in certain care facilities, or travel to or work in countries with a high rate of hepatitis B should be immunized.  Haemophilus influenzae type b (Hib) vaccine. A previously unvaccinated person with asplenia or  sickle cell disease or having a scheduled splenectomy should receive 1 dose of Hib vaccine. Regardless of previous immunization, a recipient of a hematopoietic stem cell transplant should receive a 3-dose series 6-12 months after her successful transplant. Hib vaccine is not recommended for adults with HIV infection. Preventive Services / Frequency Ages 53 to 29 years  Blood pressure check.** / Every 3-5 years.  Lipid and cholesterol check.** / Every 5 years beginning at age 60.  Clinical breast exam.** / Every 3 years for women in their 69s and 44s.  BRCA-related cancer risk assessment.** / For women who have family members with a BRCA-related cancer (breast, ovarian, tubal, or peritoneal cancers).  Pap test.** / Every 2 years from ages 71 through 24. Every 3 years starting at age 35 through age 53 or 28 with a history of 3 consecutive normal Pap tests.  HPV screening.** / Every 3 years from ages 36 through ages 15 to 58 with a history of 3 consecutive normal Pap tests.  Hepatitis C blood test.** / For any individual with known risks for hepatitis C.  Skin self-exam. / Monthly.  Influenza vaccine. / Every year.  Tetanus, diphtheria, and acellular pertussis (Tdap, Td) vaccine.** / Consult your health care provider. Pregnant women should receive 1 dose of Tdap vaccine during each pregnancy. 1 dose of Td every 10 years.  Varicella vaccine.** / Consult your health care provider. Pregnant females who do not have evidence of immunity should receive the first dose after pregnancy.  HPV vaccine. / 3 doses over 6 months, if 32 and younger. The vaccine is not recommended for use in pregnant females. However, pregnancy testing is not needed before receiving a dose.  Measles, mumps, rubella (MMR) vaccine.** / You need at least 1 dose of MMR if you were born in 1957 or later. You may also need a 2nd dose. For  females of childbearing age, rubella immunity should be determined. If there is no evidence  of immunity, females who are not pregnant should be vaccinated. If there is no evidence of immunity, females who are pregnant should delay immunization until after pregnancy.  Pneumococcal 13-valent conjugate (PCV13) vaccine.** / Consult your health care provider.  Pneumococcal polysaccharide (PPSV23) vaccine.** / 1 to 2 doses if you smoke cigarettes or if you have certain conditions.  Meningococcal vaccine.** / 1 dose if you are age 22 to 79 years and a Market researcher living in a residence hall, or have one of several medical conditions, you need to get vaccinated against meningococcal disease. You may also need additional booster doses.  Hepatitis A vaccine.** / Consult your health care provider.  Hepatitis B vaccine.** / Consult your health care provider.  Haemophilus influenzae type b (Hib) vaccine.** / Consult your health care provider. Ages 74 to 10 years  Blood pressure check.** / Every year.  Lipid and cholesterol check.** / Every 5 years beginning at age 65 years.  Lung cancer screening. / Every year if you are aged 94-80 years and have a 30-pack-year history of smoking and currently smoke or have quit within the past 15 years. Yearly screening is stopped once you have quit smoking for at least 15 years or develop a health problem that would prevent you from having lung cancer treatment.  Clinical breast exam.** / Every year after age 66 years.  BRCA-related cancer risk assessment.** / For women who have family members with a BRCA-related cancer (breast, ovarian, tubal, or peritoneal cancers).  Mammogram.** / Every year beginning at age 84 years and continuing for as long as you are in good health. Consult with your health care provider.  Pap test.** / Every 3 years starting at age 54 years through age 67 or 29 years with a history of 3 consecutive normal Pap tests.  HPV screening.** / Every 3 years from ages 90 years through ages 70 to 23 years with a history of 3  consecutive normal Pap tests.  Fecal occult blood test (FOBT) of stool. / Every year beginning at age 47 years and continuing until age 9 years. You may not need to do this test if you get a colonoscopy every 10 years.  Flexible sigmoidoscopy or colonoscopy.** / Every 5 years for a flexible sigmoidoscopy or every 10 years for a colonoscopy beginning at age 61 years and continuing until age 16 years.  Hepatitis C blood test.** / For all people born from [birth year] through 1965 and any individual with known risks for hepatitis C.  Skin self-exam. / Monthly.  Influenza vaccine. / Every year.  Tetanus, diphtheria, and acellular pertussis (Tdap/Td) vaccine.** / Consult your health care provider. Pregnant women should receive 1 dose of Tdap vaccine during each pregnancy. 1 dose of Td every 10 years.  Varicella vaccine.** / Consult your health care provider. Pregnant females who do not have evidence of immunity should receive the first dose after pregnancy.  Zoster vaccine.** / 1 dose for adults aged 25 years or older.  Measles, mumps, rubella (MMR) vaccine.** / You need at least 1 dose of MMR if you were born in 1957 or later. You may also need a second dose. For females of childbearing age, rubella immunity should be determined. If there is no evidence of immunity, females who are not pregnant should be vaccinated. If there is no evidence of immunity, females who are pregnant should delay immunization until after pregnancy.  Pneumococcal  13-valent conjugate (PCV13) vaccine.** / Consult your health care provider.  Pneumococcal polysaccharide (PPSV23) vaccine.** / 1 to 2 doses if you smoke cigarettes or if you have certain conditions.  Meningococcal vaccine.** / Consult your health care provider.  Hepatitis A vaccine.** / Consult your health care provider.  Hepatitis B vaccine.** / Consult your health care provider.  Haemophilus influenzae type b (Hib) vaccine.** / Consult your health care  provider. Ages 19 years and over  Blood pressure check.** / Every year.  Lipid and cholesterol check.** / Every 5 years beginning at age 74 years.  Lung cancer screening. / Every year if you are aged 36-80 years and have a 30-pack-year history of smoking and currently smoke or have quit within the past 15 years. Yearly screening is stopped once you have quit smoking for at least 15 years or develop a health problem that would prevent you from having lung cancer treatment.  Clinical breast exam.** / Every year after age 13 years.  BRCA-related cancer risk assessment.** / For women who have family members with a BRCA-related cancer (breast, ovarian, tubal, or peritoneal cancers).  Mammogram.** / Every year beginning at age 36 years and continuing for as long as you are in good health. Consult with your health care provider.  Pap test.** / Every 3 years starting at age 60 years through age 47 or 43 years with 3 consecutive normal Pap tests. Testing can be stopped between 65 and 70 years with 3 consecutive normal Pap tests and no abnormal Pap or HPV tests in the past 10 years.  HPV screening.** / Every 3 years from ages 12 years through ages 83 or 83 years with a history of 3 consecutive normal Pap tests. Testing can be stopped between 65 and 70 years with 3 consecutive normal Pap tests and no abnormal Pap or HPV tests in the past 10 years.  Fecal occult blood test (FOBT) of stool. / Every year beginning at age 36 years and continuing until age 4 years. You may not need to do this test if you get a colonoscopy every 10 years.  Flexible sigmoidoscopy or colonoscopy.** / Every 5 years for a flexible sigmoidoscopy or every 10 years for a colonoscopy beginning at age 83 years and continuing until age 64 years.  Hepatitis C blood test.** / For all people born from [birth year] through 1965 and any individual with known risks for hepatitis C.  Osteoporosis screening.** / A one-time screening for women ages  1 years and over and women at risk for fractures or osteoporosis.  Skin self-exam. / Monthly.  Influenza vaccine. / Every year.  Tetanus, diphtheria, and acellular pertussis (Tdap/Td) vaccine.** / 1 dose of Td every 10 years.  Varicella vaccine.** / Consult your health care provider.  Zoster vaccine.** / 1 dose for adults aged 61 years or older.  Pneumococcal 13-valent conjugate (PCV13) vaccine.** / Consult your health care provider.  Pneumococcal polysaccharide (PPSV23) vaccine.** / 1 dose for all adults aged 22 years and older.  Meningococcal vaccine.** / Consult your health care provider.  Hepatitis A vaccine.** / Consult your health care provider.  Hepatitis B vaccine.** / Consult your health care provider.  Haemophilus influenzae type b (Hib) vaccine.** / Consult your health care provider. ** Family history and personal history of risk and conditions may change your health care provider's recommendations.   This information is not intended to replace advice given to you by your health care provider. Make sure you discuss any questions you have with  your health care provider.   Document Released: 07/28/2001 Document Revised: 06/22/2014 Document Reviewed: 10/27/2010 Elsevier Interactive Patient Education [employer].

## 2015-03-26 NOTE — Assessment & Plan Note (Signed)
Urine dip with + LE. Will start Macrobid while sending urine for culture. Increase fluids and start Probiotic. Will alter regimen based on results.

## 2015-03-26 NOTE — Assessment & Plan Note (Signed)
Depression/Anxiety screen positive -- Addressed this visit (see A/P) Health Maintenance reviewed -- Declines flu shot. Referral to GYN placed for PAP per patient request. Preventive schedule discussed and handout given in AVS. Will obtain fasting labs today.

## 2015-03-26 NOTE — Progress Notes (Signed)
Pre visit review using our clinic review tool, if applicable. No additional management support is needed unless otherwise documented below in the visit note/SLS  

## 2015-03-26 NOTE — Assessment & Plan Note (Signed)
Handout on [facility] counselors given to patient. Will stop Celexa and begin Zoloft. Continue Ativan. Follow-up 1 month.

## 2015-03-27 ENCOUNTER — Telehealth: Payer: Self-pay | Admitting: Physician Assistant

## 2015-03-27 LAB — CULTURE, URINE COMPREHENSIVE
Colony Count: NO GROWTH
Organism ID, Bacteria: NO GROWTH

## 2015-03-27 NOTE — Telephone Encounter (Signed)
Faxed request for medical records to  [facility]  Address: [address], [place], [place] [id number]  Phone: [phone]

## 2015-06-25 ENCOUNTER — Telehealth: Payer: Self-pay | Admitting: *Deleted

## 2015-06-25 DIAGNOSIS — F411 Generalized anxiety disorder: Secondary | ICD-10-CM

## 2015-06-25 MED ORDER — LORAZEPAM 0.5 MG PO TABS: 0.5000 mg | ORAL_TABLET | Freq: Two times a day (BID) | ORAL | Status: DC

## 2015-06-25 NOTE — Telephone Encounter (Signed)
[facility] @ [phone]) asking the pt to RTC regarding med refill below.//AB/CMA

## 2015-06-25 NOTE — Telephone Encounter (Signed)
Pt called back from [phone]. Ok to return her call.

## 2015-06-25 NOTE — Telephone Encounter (Signed)
Called and spoke with the pt and informed her of the note below regarding med refill.  Pt verbalized understanding and agreed.   Explained to the pt the reason for the UDS and what she has to do.  Pt verbalized understanding.  Printed CSC and placed up front along with the printed prescription.//AB/CMA

## 2015-06-25 NOTE — Telephone Encounter (Signed)
Refills granted. Rx printed and signed but she will need to pick up and give UDS sample per office policy.

## 2015-06-25 NOTE — Telephone Encounter (Signed)
Requesting Lorazepam 0.5mg -Take 1 tablet by mouth twice daily. Last refill:03/26/15;#30,2 Last OV:03/26/15 No UDS Please advise.//AB/CMA

## 2015-06-26 MED FILL — LORazepam 0.5 MG TABS: 0.5 | 15 days supply | Qty: 30 | Fill #0

## 2015-07-09 MED FILL — SERTRALINE HCL 50 MG TABLET: 50 | 30 days supply | Qty: 30 | Fill #3

## 2015-07-16 MED FILL — LORazepam 0.5 MG TABS: 0.5 | 15 days supply | Qty: 30 | Fill #1

## 2015-07-18 ENCOUNTER — Telehealth: Payer: Self-pay | Admitting: Physician Assistant

## 2015-07-18 NOTE — Telephone Encounter (Signed)
Caller name: Self  Can be reached: [phone]  Reason for call: Patient states that she received a bill for the UDS that was ordered by [facility]. States she was told that she would not be billed for it. Wants to know what she can do to get it taken care of.

## 2015-07-18 NOTE — Telephone Encounter (Signed)
Have her recheck the mail she received -- is it truly a bill or an explanation of benefits. With [facility] they will send out EOB on the cost of the test but our office never bills the patient for the screen (insurance pays for it or the office absorbs the fee). Again verify with patient. If she does confirm it is a bill then she needs to be transferred to the office manager so she can take care of it. Reassure the patient that she will not be charged.

## 2015-07-19 NOTE — Telephone Encounter (Signed)
Called and informed of below. Asked to check the bill and it is a bill. States that when they called the number they were also told that the amount was owed. Asked patient's husband to bring the bill to the office and it will be given to our Print production planner.

## 2015-07-22 ENCOUNTER — Telehealth: Payer: Self-pay | Admitting: Physician Assistant

## 2015-07-22 NOTE — Telephone Encounter (Signed)
Pt has questions about sertraline.

## 2015-07-22 NOTE — Telephone Encounter (Signed)
Angie please call pt.

## 2015-07-22 NOTE — Telephone Encounter (Signed)
Please call to assess concerns and questions.

## 2015-07-23 NOTE — Telephone Encounter (Signed)
Called and spoke with the pt and she stated that the Sertraline was not working for the anxiety.  She said it does help with the depression, but her anxiety has been up and she is staying nauseated in the morning and during the day, and also having trouble sleeping.  She has also been trying oils and teas to try to help with the anxiety.  She is still taking the medication.  Pt was last seen on (03/26/15) and was started on the Sertraline.  She was asked to RTC in 1 month but her co-pay was so high she said she was told she could call and let us know how she was doing on the medication.  She stated that she wanted to give the medication some time to work before calling.  She's wanting to know if there is something else she can try.  Pt is aware that Selena Batten is out of the office today and she said she can wait for him to address the message.  Please advise.//AB/CMA

## 2015-07-23 NOTE — Telephone Encounter (Signed)
Too complicated without knowing the patient. Make sure she is avoiding caffeine and alcohol, getting regular exercise and will defer to PMD. A visit would helpful

## 2015-07-24 MED ORDER — FLUOXETINE HCL 20 MG PO TABS: 20.0000 mg | ORAL_TABLET | Freq: Every day | ORAL | Status: DC

## 2015-07-24 MED FILL — FLUoxetine HCL 20 MG TABS: 20 | 30 days supply | Qty: 30 | Fill #0

## 2015-07-24 NOTE — Telephone Encounter (Signed)
I am sorry the sertraline isn't working. We can try Fluoxetine or Paxil at a low dose to help with anxiety. They are similar medications but most of the time if a patient doesn't respond well to initial medication, changing to another medication in the same class will work much better. Let me know what she thinks and we can call in a prescription. I will want to see her 3-4 weeks after starting medication.

## 2015-07-24 NOTE — Telephone Encounter (Signed)
Called and spoke with the pt and informed her of the notes below.  She verbalized understanding and agreed to try one of the medications for anxiety.  Asked the pt to make sure she schedules an appt for a follow-up on the new medication.  Pt agreed.  Per provider will start Fluoxetine -Take 1 tablet by mouth daily.  New rx sent to the pharmacy by e-script.//AB/CMA

## 2015-07-26 ENCOUNTER — Encounter: Payer: Self-pay | Admitting: Physician Assistant

## 2015-08-12 ENCOUNTER — Telehealth: Payer: Self-pay

## 2015-08-12 NOTE — Telephone Encounter (Signed)
Called to follow up with patient.  Left a message for call back.   

## 2015-08-12 NOTE — Telephone Encounter (Addendum)
[facility] note received via fax below:  Date:  08/09/15  Time:  8:30:41pm  Caller: [place] (spouse) Nurse:  Elenore Paddy  Chief complaint:  Prescription refill or medication request (non-symptomatic) Initial comment:  Caller states his wife is needing a refill on her medication.    Disposition:  Attempt made- message left           Final attempt made-message left

## 2015-08-19 MED FILL — LORazepam 0.5 MG TABS: 0.5 | 15 days supply | Qty: 30 | Fill #2

## 2015-08-27 ENCOUNTER — Ambulatory Visit (INDEPENDENT_AMBULATORY_CARE_PROVIDER_SITE_OTHER): Payer: BLUE CROSS/BLUE SHIELD | Admitting: Physician Assistant

## 2015-08-27 ENCOUNTER — Encounter: Payer: Self-pay | Admitting: Physician Assistant

## 2015-08-27 VITALS — BP 96/70 | HR 63 | Temp 98.0°F | Ht 64.0 in | Wt 142.4 lb

## 2015-08-27 DIAGNOSIS — F411 Generalized anxiety disorder: Secondary | ICD-10-CM

## 2015-08-27 MED ORDER — FLUOXETINE HCL 20 MG PO TABS: 20.0000 mg | ORAL_TABLET | Freq: Every day | ORAL | Status: DC

## 2015-08-27 MED FILL — FLUoxetine HCL 20 MG TABS: 20 | 30 days supply | Qty: 30 | Fill #0

## 2015-08-27 NOTE — Patient Instructions (Signed)
I am glad you are doing well. Please continue medications as directed. Refills have been sent.  Follow-up in 6 months for physical. Return sooner if needed.

## 2015-08-27 NOTE — Progress Notes (Signed)
   Patient presents to clinic today for follow-up of generalized anxiety disorder after starting Fluoxetine 20 mg daily. Was hesitant to start giving the brand name (Prozac) but did start the medicine and has been taking as directed. Has noted significant improvement in anxiety. Only using Ativan PRN now (maybe once every few days). Denies depressed mood or anhedonia.  Past Medical History  Diagnosis Date  . Anxiety     Current Outpatient Prescriptions on File Prior to Visit  Medication Sig Dispense Refill  . FLUoxetine (PROZAC) 20 MG tablet Take 1 tablet (20 mg total) by mouth daily. 30 tablet 0  . LORazepam (ATIVAN) 0.5 MG tablet Take 1 tablet (0.5 mg total) by mouth 2 (two) times daily. 30 tablet 2  . Multiple Vitamins-Minerals (MULTIVITAMIN ADULT PO) Take 1 tablet by mouth daily. Women's Health     No current facility-administered medications on file prior to visit.    Allergies  Allergen Reactions  . Latex Rash    Family History  Problem Relation Age of Onset  . Fibromyalgia Father   . Hypertension Maternal Grandmother     Social History   Social History  . Marital Status: Married    Spouse Name: N/A  . Number of Children: N/A  . Years of Education: N/A   Social History Main Topics  . Smoking status: Former Games developermoker  . Smokeless tobacco: Never Used  . Alcohol Use: 0.0 oz/week    0 Standard drinks or equivalent per week  . Drug Use: No  . Sexual Activity:    Partners: Male   Other Topics Concern  . None   Social History Narrative   Review of Systems - See HPI.  All other ROS are negative.  BP 96/70 mmHg  Pulse 63  Temp(Src) 98 F (36.7 C) (Oral)  Ht 5\' 4"  (1.626 m)  Wt 142 lb 6.4 oz (64.592 kg)  BMI 24.43 kg/m2  SpO2 98%  LMP 08/20/2015  Physical Exam  Constitutional: She is well-developed, well-nourished, and in no distress.  HENT:  Head: Normocephalic and atraumatic.  Cardiovascular: Normal rate, regular rhythm, normal heart sounds and intact  distal pulses.   Pulmonary/Chest: Effort normal.  Skin: Skin is warm and dry. No rash noted.  Psychiatric: Affect normal.  Vitals reviewed.   No results found for this or any previous visit (from the past 2160 hour(s)).  Assessment/Plan: Generalized anxiety disorder Patient doing very well. Will continue current regimen. Follow-up 6 months for CPE.

## 2015-08-27 NOTE — Progress Notes (Signed)
Pre visit review using our clinic review tool, if applicable. No additional management support is needed unless otherwise documented below in the visit note. 

## 2015-08-27 NOTE — Assessment & Plan Note (Signed)
Patient doing very well. Will continue current regimen. Follow-up 6 months for CPE.

## 2015-09-18 ENCOUNTER — Telehealth: Payer: Self-pay | Admitting: *Deleted

## 2015-09-18 DIAGNOSIS — F411 Generalized anxiety disorder: Secondary | ICD-10-CM

## 2015-09-18 MED ORDER — LORAZEPAM 0.5 MG PO TABS: 0.5000 mg | ORAL_TABLET | Freq: Two times a day (BID) | ORAL | Status: DC

## 2015-09-18 MED FILL — LORazepam 0.5 MG TABS: 0.5 | 15 days supply | Qty: 30 | Fill #0

## 2015-09-18 NOTE — Telephone Encounter (Signed)
Refill granted -- same sig, quantity and additional refills. Need to know what pharmacy to send to.

## 2015-09-18 NOTE — Telephone Encounter (Signed)
Requesting Lorazepam 0.5mg -Take 1 tablet by mouth 2 times daily. Last refill:06/25/15;#30,2 Last OV:08/27/15 UDS:06/25/15-Low risk-Next screen:12/23/15 Please advise.//AB/CMA

## 2015-09-18 NOTE — Telephone Encounter (Signed)
Rx printed and faxed to the pharmacy.  Confirmation received.//AB/CMA 

## 2015-09-23 MED FILL — FLUoxetine HCL 20 MG TABS: 20 | 30 days supply | Qty: 30 | Fill #1

## 2015-10-21 MED FILL — LORazepam 0.5 MG TABS: 0.5 | 15 days supply | Qty: 30 | Fill #1

## 2015-10-21 MED FILL — FLUoxetine HCL 20 MG TABS: 20 | 30 days supply | Qty: 30 | Fill #2

## 2015-11-21 MED FILL — FLUoxetine HCL 20 MG TABS: 20 | 30 days supply | Qty: 30 | Fill #3

## 2015-11-21 MED FILL — LORazepam 0.5 MG TABS: 0.5 | 15 days supply | Qty: 30 | Fill #2

## 2015-12-19 ENCOUNTER — Other Ambulatory Visit: Payer: Self-pay | Admitting: Physician Assistant

## 2015-12-19 MED FILL — FLUoxetine HCL 20 MG TABS: 20 | 30 days supply | Qty: 30 | Fill #4

## 2015-12-19 NOTE — Telephone Encounter (Signed)
Last filled:  09/18/15 Amt: 30, 2 Last OV: 08/27/15 06/25/15 controlled substance contract signed, uds sample given, Low risk, next screen 12/23/15.  Due for UDS.   Please advise.

## 2015-12-20 MED FILL — LORazepam 0.5 MG TABS: 0.5 | 15 days supply | Qty: 30 | Fill #0

## 2015-12-20 NOTE — Telephone Encounter (Signed)
Rx faxed to pharmacy/SLS 07/07

## 2016-01-16 MED FILL — LORazepam 0.5 MG TABS: 0.5 | 15 days supply | Qty: 30 | Fill #1

## 2016-01-17 MED FILL — FLUoxetine HCL 20 MG TABS: 20 | 30 days supply | Qty: 30 | Fill #5

## 2016-01-22 ENCOUNTER — Encounter: Payer: Self-pay | Admitting: Physician Assistant

## 2016-01-22 ENCOUNTER — Ambulatory Visit (INDEPENDENT_AMBULATORY_CARE_PROVIDER_SITE_OTHER): Payer: BLUE CROSS/BLUE SHIELD | Admitting: Physician Assistant

## 2016-01-22 DIAGNOSIS — F418 Other specified anxiety disorders: Secondary | ICD-10-CM | POA: Diagnosis not present

## 2016-01-22 DIAGNOSIS — F32A Depression, unspecified: Secondary | ICD-10-CM

## 2016-01-22 DIAGNOSIS — F329 Major depressive disorder, single episode, unspecified: Secondary | ICD-10-CM

## 2016-01-22 DIAGNOSIS — F419 Anxiety disorder, unspecified: Principal | ICD-10-CM

## 2016-01-22 MED ORDER — CITALOPRAM HYDROBROMIDE 20 MG PO TABS: 20.0000 mg | ORAL_TABLET | Freq: Every day | ORAL | 3 refills | Status: DC

## 2016-01-22 MED FILL — CITALOPRAM HBR 20 MG TABLET: 20 | 30 days supply | Qty: 30 | Fill #0

## 2016-01-22 NOTE — Progress Notes (Signed)
   Patient presents to clinic today for follow-up of anxiety and depression, previously well-controlled with Fluoxetine 20 mg daily. Is now noting feeling sleep disturbances, a feeling of being withdrawn from family. Endorses significant anhedonia. Notes depressed mood. Denies SI/HI. Anxiety doing ok overall. Would like to discuss change in medication.   Past Medical History:  Diagnosis Date  . Anxiety     Current Outpatient Prescriptions on File Prior to Visit  Medication Sig Dispense Refill  . LORazepam (ATIVAN) 0.5 MG tablet TAKE 1 TABLET BY MOUTH TWICE DAILY 30 tablet 1  . Multiple Vitamins-Minerals (MULTIVITAMIN ADULT PO) Take 1 tablet by mouth daily. Women's Health     No current facility-administered medications on file prior to visit.     Allergies  Allergen Reactions  . Latex Rash    Family History  Problem Relation Age of Onset  . Fibromyalgia Father   . Hypertension Maternal Grandmother     Social History   Social History  . Marital status: Married    Spouse name: N/A  . Number of children: N/A  . Years of education: N/A   Social History Main Topics  . Smoking status: Former Games developermoker  . Smokeless tobacco: Never Used  . Alcohol use 0.0 oz/week  . Drug use: No  . Sexual activity: Yes    Partners: Male   Other Topics Concern  . None   Social History Narrative  . None   Review of Systems - See HPI.  All other ROS are negative.  BP (!) 88/62 (BP Location: Right Arm, Patient Position: Sitting, Cuff Size: Normal)   Pulse 73   Temp 98 F (36.7 C) (Oral)   Resp 16   Ht 5\' 4"  (1.626 m)   Wt 143 lb (64.9 kg)   LMP 12/22/2015   SpO2 99%   BMI 24.55 kg/m   Physical Exam  Constitutional: She is oriented to person, place, and time and well-developed, well-nourished, and in no distress.  HENT:  Head: Normocephalic and atraumatic.  Cardiovascular: Normal rate, regular rhythm, normal heart sounds and intact distal pulses.   Pulmonary/Chest: Effort normal.    Neurological: She is oriented to person, place, and time.  Skin: Skin is warm and dry.  Psychiatric: Affect normal. Her mood appears anxious. She exhibits a depressed mood. She expresses no homicidal and no suicidal ideation. She expresses no suicidal plans and no homicidal plans.  Vitals reviewed.  Assessment/Plan: Anxiety and depression Deteriorating with Prozac. Will stop and begin Citalopram 20 mg daily. No alarm signs or symptoms. Will plan on FU 1 month. I will make note to call patient in 2 weeks to check on her. Alarm signs/symptoms reviewed that would prompt ER assessment. Patient voices understanding of plan and instructions.    Piedad ClimesMartin, Persephanie Laatsch [place], PA-C

## 2016-01-22 NOTE — Patient Instructions (Signed)
Please stop the Prozac and begin the Citalopram as directed. Continue the Valium as needed for anxiety.  I want you to follow-up with me in 1 month.  I will be calling you in a couple of weeks to check on you.  If you notice any worsening mood on the new medication, stop it and call/come see me immediately.  If you ever have feelings of harming yourself or others, please call 911 or go an ER immediately.

## 2016-01-22 NOTE — Assessment & Plan Note (Signed)
Deteriorating with Prozac. Will stop and begin Citalopram 20 mg daily. No alarm signs or symptoms. Will plan on FU 1 month. I will make note to call patient in 2 weeks to check on her. Alarm signs/symptoms reviewed that would prompt ER assessment. Patient voices understanding of plan and instructions.

## 2016-02-11 ENCOUNTER — Other Ambulatory Visit: Payer: Self-pay | Admitting: Physician Assistant

## 2016-02-11 NOTE — Telephone Encounter (Signed)
Rx faxed to pharmacy/SLS 08/29

## 2016-02-11 NOTE — Telephone Encounter (Signed)
eScribe request from [facility] for refill on Lorazepam 1 mg [Take one tablet BID] Last filled - 12/14/15, #30x1 Last AEX - 01/22/16 Next AEX - 1-Mth. Please Advise on refills/SLS 08/29

## 2016-02-12 MED FILL — LORazepam 0.5 MG TABS: 0.5 | 15 days supply | Qty: 30 | Fill #0

## 2016-02-19 MED FILL — CITALOPRAM HBR 20 MG TABLET: 20 | 30 days supply | Qty: 30 | Fill #1

## 2016-02-24 ENCOUNTER — Ambulatory Visit (INDEPENDENT_AMBULATORY_CARE_PROVIDER_SITE_OTHER): Payer: BLUE CROSS/BLUE SHIELD | Admitting: Physician Assistant

## 2016-02-24 ENCOUNTER — Encounter: Payer: Self-pay | Admitting: Physician Assistant

## 2016-02-24 VITALS — BP 96/66 | HR 79 | Temp 98.4°F | Resp 16 | Ht 64.0 in | Wt 144.2 lb

## 2016-02-24 DIAGNOSIS — F418 Other specified anxiety disorders: Secondary | ICD-10-CM

## 2016-02-24 DIAGNOSIS — F419 Anxiety disorder, unspecified: Principal | ICD-10-CM

## 2016-02-24 DIAGNOSIS — F32A Depression, unspecified: Secondary | ICD-10-CM

## 2016-02-24 DIAGNOSIS — F329 Major depressive disorder, single episode, unspecified: Secondary | ICD-10-CM

## 2016-02-24 MED ORDER — LORAZEPAM 1 MG PO TABS: 1.0000 mg | ORAL_TABLET | Freq: Two times a day (BID) | ORAL | 1 refills | Status: DC

## 2016-02-24 MED ORDER — CITALOPRAM HYDROBROMIDE 40 MG PO TABS: 40.0000 mg | ORAL_TABLET | Freq: Every day | ORAL | 1 refills | Status: DC

## 2016-02-24 MED FILL — CITALOPRAM HBR 40 MG TABLET: 40 | 30 days supply | Qty: 30 | Fill #0

## 2016-02-24 MED FILL — LORazepam 1 MG TABS: 1 | 15 days supply | Qty: 30 | Fill #0

## 2016-02-24 NOTE — Assessment & Plan Note (Signed)
Depression much improved. Anxiety still present but less severe. Increase Citalopram to 40 mg daily. Ativan as directed. FU scheduled.

## 2016-02-24 NOTE — Patient Instructions (Signed)
Please start the new dose of medications. Follow-up with me in 1 month. If you notice symptoms are not improving or if anything changes, please call or come see me ASAP.

## 2016-02-24 NOTE — Progress Notes (Signed)
   Patient presents to clinic today for follow-up of anxiety and depression. At last visit noted deterioration of symptoms with Fluoxetine so she was switched to Citalopram 20 mg daily. Is taking as directed and tolerating well. Endorses great improvement in mood but still dealing with anxiety, especially in social situations. Is having to use her Ativan twice daily usually. Denies SI/HI.  Past Medical History:  Diagnosis Date  . Anxiety     Current Outpatient Prescriptions on File Prior to Visit  Medication Sig Dispense Refill  . Multiple Vitamins-Minerals (MULTIVITAMIN ADULT PO) Take 1 tablet by mouth daily. Women's Health     No current facility-administered medications on file prior to visit.     Allergies  Allergen Reactions  . Mosquito (Culex Pipiens) Allergy Skin Test Swelling    Eye.  . Latex Rash    Family History  Problem Relation Age of Onset  . Fibromyalgia Father   . Hypertension Maternal Grandmother     Social History   Social History  . Marital status: Married    Spouse name: N/A  . Number of children: N/A  . Years of education: N/A   Social History Main Topics  . Smoking status: Former Games developermoker  . Smokeless tobacco: Never Used  . Alcohol use 0.0 oz/week  . Drug use: No  . Sexual activity: Yes    Partners: Male   Other Topics Concern  . None   Social History Narrative  . None   Review of Systems - See HPI.  All other ROS are negative.  BP 96/66 (BP Location: Left Arm, Patient Position: Sitting, Cuff Size: Normal)   Pulse 79   Temp 98.4 F (36.9 C) (Oral)   Resp 16   Ht 5\' 4"  (1.626 m)   Wt 144 lb 4 oz (65.4 kg)   LMP 02/17/2016   SpO2 98%   BMI 24.76 kg/m   Physical Exam  Constitutional: She is oriented to person, place, and time and well-developed, well-nourished, and in no distress.  HENT:  Head: Normocephalic and atraumatic.  Neck: Neck supple.  Cardiovascular: Normal rate, regular rhythm, normal heart sounds and intact distal  pulses.   Pulmonary/Chest: Effort normal and breath sounds normal. No respiratory distress. She has no wheezes. She has no rales. She exhibits no tenderness.  Neurological: She is alert and oriented to person, place, and time.  Skin: Skin is warm and dry. No rash noted.  Psychiatric: Affect normal.  Vitals reviewed.  Assessment/Plan: No problem-specific Assessment & Plan notes found for this encounter.    Piedad ClimesMartin, Colby Reels [place], PA-C

## 2016-03-26 MED FILL — LORazepam 1 MG TABS: 1 | 15 days supply | Qty: 30 | Fill #1

## 2016-03-26 MED FILL — CITALOPRAM HBR 40 MG TABLET: 40 | 30 days supply | Qty: 30 | Fill #1

## 2016-04-21 ENCOUNTER — Other Ambulatory Visit: Payer: Self-pay | Admitting: Physician Assistant

## 2016-04-21 DIAGNOSIS — F32A Depression, unspecified: Secondary | ICD-10-CM

## 2016-04-21 DIAGNOSIS — F329 Major depressive disorder, single episode, unspecified: Secondary | ICD-10-CM

## 2016-04-21 DIAGNOSIS — F419 Anxiety disorder, unspecified: Principal | ICD-10-CM

## 2016-04-21 MED FILL — CITALOPRAM HBR 40 MG TABLET: 40 | 30 days supply | Qty: 30 | Fill #0

## 2016-04-21 NOTE — Telephone Encounter (Signed)
eScribe request from [facility] for refill on Citalopram & Lorazepam Last filled - 02/24/16, #30x1 Last AEX - 02/24/16 Next AEX - Patient was to F/U in one month d/t increase on dosage of both requested medications; per provider VO, 30-day supply with note: Requested drug refills are authorized, however, the patient needs further evaluation and/or laboratory testing before further refills are given. Ask her to make an appointment for this/SLS 11/07  Please call patient and schedule F/U office visit prior to any future refills authorizations per provider/SLS 11/07

## 2016-04-27 ENCOUNTER — Telehealth: Payer: Self-pay | Admitting: Physician Assistant

## 2016-04-27 ENCOUNTER — Other Ambulatory Visit: Payer: Self-pay | Admitting: *Deleted

## 2016-04-27 MED ORDER — LORAZEPAM 1 MG PO TABS: 1.0000 mg | ORAL_TABLET | Freq: Two times a day (BID) | ORAL | 0 refills | Status: DC

## 2016-04-27 MED FILL — LORazepam 1 MG TABS: 1 | 15 days supply | Qty: 30 | Fill #0

## 2016-04-27 NOTE — Telephone Encounter (Signed)
Ok with me 

## 2016-04-27 NOTE — Telephone Encounter (Signed)
ok 

## 2016-04-27 NOTE — Progress Notes (Signed)
Rx resend via telephone d/t not being received via fax on 04/21/16 when Citalopram was sent via escript as well/SLS 11/13

## 2016-04-27 NOTE — Telephone Encounter (Signed)
Patient would like to transfer from [place] to Dr. Copland, please advise °

## 2016-04-27 NOTE — Telephone Encounter (Signed)
Patient scheduled for 05/25/2016

## 2016-04-30 NOTE — Telephone Encounter (Signed)
Patient has appointment 12/11. [facility] [place]

## 2016-05-25 ENCOUNTER — Ambulatory Visit (INDEPENDENT_AMBULATORY_CARE_PROVIDER_SITE_OTHER): Payer: BLUE CROSS/BLUE SHIELD | Admitting: Family Medicine

## 2016-05-25 ENCOUNTER — Encounter: Payer: Self-pay | Admitting: Family Medicine

## 2016-05-25 DIAGNOSIS — F419 Anxiety disorder, unspecified: Principal | ICD-10-CM

## 2016-05-25 DIAGNOSIS — F418 Other specified anxiety disorders: Secondary | ICD-10-CM

## 2016-05-25 DIAGNOSIS — F32A Depression, unspecified: Secondary | ICD-10-CM

## 2016-05-25 DIAGNOSIS — F329 Major depressive disorder, single episode, unspecified: Secondary | ICD-10-CM

## 2016-05-25 MED ORDER — LORAZEPAM 1 MG PO TABS
1.0000 mg | ORAL_TABLET | Freq: Two times a day (BID) | ORAL | 2 refills | Status: DC
Start: 2016-05-25 — End: 2016-05-25

## 2016-05-25 MED ORDER — CITALOPRAM HYDROBROMIDE 40 MG PO TABS: 40.0000 mg | ORAL_TABLET | Freq: Every day | ORAL | 3 refills | Status: DC

## 2016-05-25 MED ORDER — LORAZEPAM 1 MG PO TABS: 1.0000 mg | ORAL_TABLET | Freq: Two times a day (BID) | ORAL | 2 refills | Status: DC

## 2016-05-25 MED FILL — LORazepam 1 MG TABS: 1 | 15 days supply | Qty: 30 | Fill #0

## 2016-05-25 MED FILL — CITALOPRAM HBR 40 MG TABLET: 40 | 90 days supply | Qty: 90 | Fill #0

## 2016-05-25 NOTE — Progress Notes (Signed)
[facility] [address], [address] [place], [place] [address] [phone] [phone]  Date:  05/25/2016   Name:  Vanessa RhymesValerie Sampson   DOB:  [birth year]   MRN:  [id number]  PCP:  Abbe AmsterdamOPLAND,Zamia Tyminski, MD    Chief Complaint: Establish Care (Former pt of Cheswoldody Martin. Here to est. Will need refills. Declined flu vaccine.)   History of Present Illness:  Vanessa Sampson is a 35 y.o. very pleasant female patient who presents with the following:  History of anxiety and depression- last seen by Selena Battenody about 3 months ago He is treated with celexa and ativan She was changed to celexa a few months ago and this worked well.   Her anxiety is still "there,"  Her sx here are stable.   No SI or severe sx currently   She has suffered from anxiety for a long time- she started on medication at age 89, but thinks she has had anxiety since she was a teen.  LMP 11/20/217  Things are stable and ok at home, work. She does dental assiting, and commercial cleaning work.    BP Readings from Last 3 Encounters:  05/25/16 (!) 96/56  02/24/16 96/66  01/22/16 (!) 88/62    She last had labs about one year ago- she would prefer to wait on these today  Reviewed NCCSR- no unexpected entries noted. She generally gets #30 ativan every 30 days  Her contract and UDS at [employer]   Patient Active Problem List   Diagnosis Date Noted  . Visit for preventive health examination 03/26/2015  . Anxiety and depression 10/10/2014  . Chronic idiopathic constipation 10/10/2014    Past Medical History:  Diagnosis Date  . Anxiety     Past Surgical History:  Procedure Laterality Date  . APPENDECTOMY  2011  . WISDOM TOOTH EXTRACTION      Social History  Substance Use Topics  . Smoking status: Former Games developermoker  . Smokeless tobacco: Never Used  . Alcohol use 0.0 oz/week    Family History  Problem Relation Age of Onset  . Fibromyalgia Father   . Hypertension Maternal Grandmother      Allergies  Allergen Reactions  . Mosquito (Culex Pipiens) Allergy Skin Test Swelling    Eye.  . Latex Rash    Medication list has been reviewed and updated.  Current Outpatient Prescriptions on File Prior to Visit  Medication Sig Dispense Refill  . citalopram (CELEXA) 40 MG tablet TAKE 1 TABLET (40 MG TOTAL) BY MOUTH DAILY. 30 tablet 0  . LORazepam (ATIVAN) 1 MG tablet Take 1 tablet (1 mg total) by mouth 2 (two) times daily. RX PHONED IN D/T PHARMACY NOT RECEIVING PRIOR FAX AT SAME TIME CITALOPRAM WAS SENT ON 04/21/16 30 tablet 0  . Multiple Vitamins-Minerals (MULTIVITAMIN ADULT PO) Take 1 tablet by mouth daily. Women's Health     No current facility-administered medications on file prior to visit.     Review of Systems:  As per HPI- otherwise negative.   Physical Examination: Vitals:   05/25/16 1006  BP: (!) 96/56  Pulse: 78  Temp: 98.2 F (36.8 C)   Vitals:   05/25/16 1006  Weight: 149 lb 12.8 oz (67.9 kg)  Height: 5\' 4"  (1.626 m)   Body mass index is 25.71 kg/m. Ideal Body Weight: Weight in (lb) to have BMI = 25: 145.3 LMP 11/20 GEN: WDWN, NAD, Non-toxic, A & O x 3, looks well, normal weight  HEENT: Atraumatic, Normocephalic.  Neck supple. No masses, No LAD. Ears and Nose: No external deformity. CV: RRR, No M/G/R. No JVD. No thrill. No extra heart sounds. PULM: CTA B, no wheezes, crackles, rhonchi. No retractions. No resp. distress. No accessory muscle use. ABD: S, NT, ND, +BS. No rebound. No HSM. EXTR: No c/c/e NEURO Normal gait.  PSYCH: Normally interactive. Conversant. Not depressed or anxious appearing.  Calm demeanor.   Assessment and Plan: Anxiety and depression - Plan: LORazepam (ATIVAN) 1 MG tablet, citalopram (CELEXA) 40 MG tablet  Refilled her ativan and celexa today BP Readings from Last 3 Encounters:  05/25/16 (!) 96/56  02/24/16 96/66  01/22/16 (!) 88/62   Her BP is generally on the low side  Plan to visit again in 3 months    Signed Abbe AmsterdamJessica Camillia Marcy, MD

## 2016-05-25 NOTE — Patient Instructions (Signed)
It was good to meet you today- take care and please see me in about 3 months We can do labs at your next visit as well- cholesterol, etc.  Let me know if you need anything prior to your next visit

## 2016-05-25 NOTE — Progress Notes (Signed)
Pre visit review using our clinic review tool, if applicable. No additional management support is needed unless otherwise documented below in the visit note. 

## 2016-06-15 MED FILL — LORazepam 1 MG TABS: 1 | 15 days supply | Qty: 30 | Fill #1

## 2016-07-13 MED FILL — LORazepam 1 MG TABS: 1 | 15 days supply | Qty: 30 | Fill #2

## 2016-08-13 ENCOUNTER — Other Ambulatory Visit: Payer: Self-pay | Admitting: Family Medicine

## 2016-08-13 DIAGNOSIS — F329 Major depressive disorder, single episode, unspecified: Secondary | ICD-10-CM

## 2016-08-13 DIAGNOSIS — F419 Anxiety disorder, unspecified: Principal | ICD-10-CM

## 2016-08-13 DIAGNOSIS — F32A Depression, unspecified: Secondary | ICD-10-CM

## 2016-08-13 MED FILL — LORazepam 1 MG TABS: 1 | 30 days supply | Qty: 30 | Fill #0

## 2016-08-13 NOTE — Telephone Encounter (Signed)
Received refill request for LORazepam (ATIVAN) 1MG  tablet. Last office visit and refill 05/25/16. Is it ok to refill? Please advise.

## 2016-08-13 NOTE — Telephone Encounter (Signed)
Last seen by myself in December NCCSR:   Filled lorazepam 1/29, 1/01, 12/11 No unexpected entries

## 2016-08-24 ENCOUNTER — Encounter: Payer: Self-pay | Admitting: Family Medicine

## 2016-08-24 ENCOUNTER — Ambulatory Visit (INDEPENDENT_AMBULATORY_CARE_PROVIDER_SITE_OTHER): Payer: BLUE CROSS/BLUE SHIELD | Admitting: Family Medicine

## 2016-08-24 VITALS — BP 109/70 | HR 72 | Temp 98.2°F | Ht 64.0 in | Wt 150.0 lb

## 2016-08-24 DIAGNOSIS — F418 Other specified anxiety disorders: Secondary | ICD-10-CM | POA: Diagnosis not present

## 2016-08-24 DIAGNOSIS — Z13220 Encounter for screening for lipoid disorders: Secondary | ICD-10-CM | POA: Diagnosis not present

## 2016-08-24 DIAGNOSIS — Z131 Encounter for screening for diabetes mellitus: Secondary | ICD-10-CM

## 2016-08-24 DIAGNOSIS — Z1329 Encounter for screening for other suspected endocrine disorder: Secondary | ICD-10-CM | POA: Diagnosis not present

## 2016-08-24 DIAGNOSIS — Z130 Encounter for screening for diseases of the blood and blood-forming organs and certain disorders involving the immune mechanism: Secondary | ICD-10-CM

## 2016-08-24 DIAGNOSIS — F419 Anxiety disorder, unspecified: Principal | ICD-10-CM

## 2016-08-24 DIAGNOSIS — F32A Depression, unspecified: Secondary | ICD-10-CM

## 2016-08-24 DIAGNOSIS — F329 Major depressive disorder, single episode, unspecified: Secondary | ICD-10-CM

## 2016-08-24 LAB — LIPID PANEL
Cholesterol: 197 mg/dL (ref 0–200)
HDL: 55.1 mg/dL (ref >39.00)
LDL CALC: 128 mg/dL — AB (ref 0–99)
NONHDL: 141.97
Total CHOL/HDL Ratio: 4
Triglycerides: 72 mg/dL (ref 0.0–149.0)
VLDL: 14.4 mg/dL (ref 0.0–40.0)

## 2016-08-24 LAB — COMPREHENSIVE METABOLIC PANEL
ALT: 9 U/L (ref 0–35)
AST: 14 U/L (ref 0–37)
Albumin: 4.2 g/dL (ref 3.5–5.2)
Alkaline Phosphatase: 79 U/L (ref 39–117)
BUN: 8 mg/dL (ref 6–23)
CHLORIDE: 104 meq/L (ref 96–112)
CO2: 26 mEq/L (ref 19–32)
Calcium: 9.7 mg/dL (ref 8.4–10.5)
Creatinine, Ser: 0.71 mg/dL (ref 0.40–1.20)
GFR: 98.93 mL/min (ref >60.00)
Glucose, Bld: 96 mg/dL (ref 70–99)
POTASSIUM: 4.4 meq/L (ref 3.5–5.1)
SODIUM: 136 meq/L (ref 135–145)
Total Bilirubin: 0.3 mg/dL (ref 0.2–1.2)
Total Protein: 7.1 g/dL (ref 6.0–8.3)

## 2016-08-24 LAB — CBC
HCT: 36.2 % (ref 36.0–46.0)
HEMOGLOBIN: 12.1 g/dL (ref 12.0–15.0)
MCHC: 33.4 g/dL (ref 30.0–36.0)
MCV: 89.7 fl (ref 78.0–100.0)
Platelets: 226 10*3/uL (ref 150.0–400.0)
RBC: 4.03 Mil/uL (ref 3.87–5.11)
RDW: 14.5 % (ref 11.5–15.5)
WBC: 5.4 10*3/uL (ref 4.0–10.5)

## 2016-08-24 LAB — HEMOGLOBIN A1C: Hgb A1c MFr Bld: 5.9 % (ref 4.6–6.5)

## 2016-08-24 NOTE — Patient Instructions (Signed)
It was nice to see you today- I will be in touch with your labs Please have your urine drug screen done today for the year   Think about adding wellbutrin to your current regimen- this can be helpful for resistant depression symptoms  Please plan to see me in about 6 months We are also glad to do a pap for you at your convenience

## 2016-08-24 NOTE — Progress Notes (Signed)
[facility] [address], [address] [place], [place] [address] [phone]  Date:  08/24/2016   Name:  Vanessa Sampson   DOB:  [birth year]   MRN:  [id number]  PCP:  Abbe Amsterdam, MD    Chief Complaint: Follow-up   History of Present Illness:  Vanessa Sampson is a 36 y.o. very pleasant female patient who presents with the following:  Here today for a follow-up visit.   Last seen by myself in December for anxiety and depression- HPI from that visit:   History of anxiety and depression- last seen by [place] about 3 months ago He is treated with celexa and ativan She was changed to celexa a few months ago and this worked well.   Her anxiety is still "there,"  Her sx here are stable.   No SI or severe sx currently   She has suffered from anxiety for a long time- she started on medication at age 41, but thinks she has had anxiety since she was a teen.  LMP 11/20/217  Things are stable and ok at home, work. She does dental assiting, and commercial cleaning work.    She notes that she is still having some symptoms of depression and anxiety.  A couple of weeks ago she felt a cold sensation over her torso- this has not occurred again and she thinks it was due to anxiety She does tend to cry pretty often- she may go through periods of feeling more down.  She recently felt more depressed for about 3 weeks, but is now getting back to her baseline.  She does not have any sx of mania.    She does not want to change her medication at this time. 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 She has noted some constipation  She is fasting today so we can get labs She is due for a pap- can do her her at her convenience   NCCSR:  Ativan filled on 08/13/16,  07/13/16/ /1/18, no unexpected entries  Patient Active Problem List   Diagnosis Date Noted  . Visit for preventive health examination 03/26/2015  . Anxiety and depression 10/10/2014  . Chronic idiopathic constipation 10/10/2014    Past Medical History:  Diagnosis Date  . Anxiety     Past Surgical History:  Procedure Laterality Date  . APPENDECTOMY  2011  . WISDOM TOOTH EXTRACTION      Social History  Substance Use Topics  . Smoking status: Former Games developer  . Smokeless tobacco: Never Used  . Alcohol use 0.0 oz/week    Family History  Problem Relation Age of Onset  . Fibromyalgia Father   . Hypertension Maternal Grandmother     Allergies  Allergen Reactions  . Mosquito (Culex Pipiens) Allergy Skin Test Swelling    Eye.  . Latex Rash    Medication list has been reviewed and updated.  Current Outpatient Prescriptions on File Prior to Visit  Medication Sig Dispense Refill  . citalopram (CELEXA) 40 MG tablet Take 1 tablet (40 mg total) by mouth daily. 90 tablet 3  . LORazepam (ATIVAN) 1 MG tablet TAKE 1 TABLET BY MOUTH TWICE DAILY 30 tablet 2  . Multiple Vitamins-Minerals (MULTIVITAMIN ADULT PO) Take 1 tablet by  mouth daily. Women's Health     No current facility-administered medications on file prior to visit.     Review of Systems:  As per HPI- otherwise negative.   Physical Examination: Vitals:   08/24/16 1012  BP: 109/70  Pulse: 72  Temp: 98.2 F (36.8 C)   Vitals:   08/24/16 1012  Weight: 150 lb (68 kg)  Height: 5\' 4"  (1.626 m)   Body mass index is 25.75 kg/m. Ideal Body Weight: Weight in (lb) to have BMI = 25: 145.3  GEN: WDWN, NAD, Non-toxic, A & O x 3, normal weight, looks well HEENT: Atraumatic, Normocephalic. Neck supple. No masses, No LAD.  Bilateral TM wnl, oropharynx normal.  PEERL,EOMI.   Ears and Nose: No external deformity. CV: RRR, No M/G/R. No JVD. No thrill. No extra heart sounds. PULM: CTA B, no wheezes, crackles, rhonchi. No  retractions. No resp. distress. No accessory muscle use. ABD: S, NT, ND, +BS. No rebound. No HSM. EXTR: No c/c/e NEURO Normal gait.  PSYCH: Normally interactive. Conversant. Not depressed or anxious appearing.  Calm demeanor.    Assessment and Plan: Anxiety and depression  Screening for deficiency anemia - Plan: CBC  Screening for diabetes mellitus - Plan: Comprehensive metabolic panel, Hemoglobin A1c  Screening for thyroid disorder - Plan: TSH  Screening for hyperlipidemia - Plan: Lipid panel  Here today for a follow-up visit She is satisfied with her current medication regimen for anxiety and depression.  Discussed adding wellbutrin but she declines for now. Will let me know if not doing ok Will plan further follow- up pending labs.   Signed Abbe AmsterdamJessica Copland, MD

## 2016-08-24 NOTE — Progress Notes (Signed)
Pre visit review using our clinic tool,if applicable. No additional management support is needed unless otherwise documented below in the visit note.  

## 2016-08-25 ENCOUNTER — Encounter: Payer: Self-pay | Admitting: Family Medicine

## 2016-08-25 LAB — TSH: TSH: 1.68 u[IU]/mL (ref 0.35–4.50)

## 2016-08-27 MED FILL — CITALOPRAM HBR 40 MG TABLET: 40 | 90 days supply | Qty: 90 | Fill #1

## 2016-09-16 MED FILL — LORazepam 1 MG TABS: 1 | 15 days supply | Qty: 30 | Fill #1

## 2016-10-21 MED FILL — LORazepam 1 MG TABS: 1 | 15 days supply | Qty: 30 | Fill #2

## 2016-11-20 ENCOUNTER — Other Ambulatory Visit: Payer: Self-pay | Admitting: Emergency Medicine

## 2016-11-20 DIAGNOSIS — F32A Depression, unspecified: Secondary | ICD-10-CM

## 2016-11-20 DIAGNOSIS — F329 Major depressive disorder, single episode, unspecified: Secondary | ICD-10-CM

## 2016-11-20 DIAGNOSIS — F419 Anxiety disorder, unspecified: Principal | ICD-10-CM

## 2016-11-20 NOTE — Telephone Encounter (Signed)
Requesting: LORazepam (ATIVAN) Contract: 08/24/16 no controlled substance contract signed UDS: uds sample given, low risk next screen 02/24/17. Last OV: 08/24/16 Last Refill: 08/13/16  Please Advise

## 2016-11-21 MED ORDER — LORAZEPAM 1 MG PO TABS: 1.0000 mg | ORAL_TABLET | Freq: Two times a day (BID) | ORAL | 5 refills | Status: DC

## 2016-11-21 NOTE — Telephone Encounter (Signed)
Indication for chronic benzo: anxiety Medication and dose: ativan 1mg , up to BID # pills per month: 30  Last UDS date: 3/180 low risk Pain contract signed (Y/N): yes Date narcotic database last reviewed (include red flags): today  NCCSR: last filled 5/09, 4/04, 3/01- no unexpected entries Will refill today

## 2016-11-23 MED FILL — LORazepam 1 MG TABS: 1 | 15 days supply | Qty: 30 | Fill #0

## 2016-11-26 MED FILL — CITALOPRAM HBR 40 MG TABLET: 40 | 90 days supply | Qty: 90 | Fill #2

## 2017-01-11 MED FILL — LORazepam 1 MG TABS: 1 | 15 days supply | Qty: 30 | Fill #1

## 2017-02-16 MED FILL — LORazepam 1 MG TABS: 1 | 15 days supply | Qty: 30 | Fill #2

## 2017-02-19 NOTE — Progress Notes (Signed)
[facility] [address], [address] [place], [place] [address] [phone]  Date:  02/22/2017   Name:  Vanessa Sampson   DOB:  [birth year]   MRN:  [id number]  PCP:  Pearline Cablesopland, Jessica C, MD    Chief Complaint: Annual Exam (Pt here for CPE with PAP. Declined flu vaccine. Needs refill on ci)   History of Present Illness:  Vanessa Sampson is a 36 y.o. very pleasant female patient who presents with the following:  I last saw her in March of this year  She notes that she is still having some symptoms of depression and anxiety.  A couple of weeks ago she felt a cold sensation over her torso- this has not occurred again and she thinks it was due to anxiety She does tend to cry pretty often- she may go through periods of feeling more down.  She recently felt more depressed for about 3 weeks, but is now getting back to her baseline.  She does not have any sx of mania.    She does not want to change her medication at this time. 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 She has noted some constipation  She is fasting today so we can get labs She is due for a pap- can do her her at her convenience   Would like to do a pap today Other labs this spring Her last pap was approx  2014- she cannot recall any abnl.   She notes a feeling like she needs to move her legs at night- she has noted this for several months.  The left foot is the most troublesome.  It is not painful however and is not a cramp. The right leg is ok    Her mother had a history of iron deficiency Menses are regular She does not bleed too heavily- she feels like her menses are normal  Her LMP was about 3 weeks ago   She feels like her celexa is working well for her- she takes it in the  evening and this seems to work well She is not sleeping that great, but better than it was  She does use the ativan at bedtime as needed but does not need this refilled today  Patient Active Problem List   Diagnosis Date Noted  . Visit for preventive health examination 03/26/2015  . Anxiety and depression 10/10/2014  . Chronic idiopathic constipation 10/10/2014    Past Medical History:  Diagnosis Date  . Anxiety     Past Surgical History:  Procedure Laterality Date  . APPENDECTOMY  2011  . WISDOM TOOTH EXTRACTION      Social History  Substance Use Topics  . Smoking status: Former Games developermoker  . Smokeless tobacco: Never Used  . Alcohol use 0.0 oz/week    Family History  Problem Relation Age of Onset  . Fibromyalgia Father   . Hypertension Maternal Grandmother     Allergies  Allergen Reactions  . Mosquito (Culex Pipiens) Allergy Skin Test Swelling    Eye.  . Latex Rash    Medication list has been reviewed and updated.  Current Outpatient Prescriptions on File Prior to Visit  Medication Sig Dispense Refill  . LORazepam (ATIVAN) 1 MG  tablet Take 1 tablet (1 mg total) by mouth 2 (two) times daily. 30 tablet 5  . Multiple Vitamins-Minerals (MULTIVITAMIN ADULT PO) Take 1 tablet by mouth daily. Women's Health    . magnesium 30 MG tablet Take 30 mg by mouth 2 (two) times daily.     No current facility-administered medications on file prior to visit.     Review of Systems:  As per HPI- otherwise negative. Menses are regular No breast change or abnl noted- however pt notes that her breasts are generally "lumpy" and would be interested in getting a baseline mammo- will order for her today No fever or chills  Physical Examination: Vitals:   02/22/17 1033  BP: 110/80  Pulse: 78  Temp: 98.4 F (36.9 C)  SpO2: 98%   Vitals:   02/22/17 1033  Weight: 145 lb 9.6 oz (66 kg)  Height:  (1.651 m)   Body mass index is 24.23 kg/m. Ideal Body Weight: Weight in (lb)  to have BMI = 25: 149.9  GEN: WDWN, NAD, Non-toxic, A & O x 3, looks well, normal weight HEENT: Atraumatic, Normocephalic. Neck supple. No masses, No LAD.  Bilateral TM wnl, oropharynx normal.  PEERL,EOMI.   Ears and Nose: No external deformity. CV: RRR, No M/G/R. No JVD. No thrill. No extra heart sounds. PULM: CTA B, no wheezes, crackles, rhonchi. No retractions. No resp. distress. No accessory muscle use. ABD: S, NT, ND, +BS. No rebound. No HSM. EXTR: No c/c/e NEURO Normal gait.  PSYCH: Normally interactive. Conversant. Not depressed or anxious appearing.  Calm demeanor.  Breast: normal exam, no masses/ dimpling/ discharge Pelvic: normal, no vaginal lesions or discharge. Uterus normal, no CMT, no adnexal tendereness or masses  Assessment and Plan: Screening for cervical cancer - Plan: Cytology - PAP  Anxiety and depression - Plan: citalopram (CELEXA) 40 MG tablet  Screening for breast cancer - Plan: MM SCREENING BREAST TOMO BILATERAL  Restless legs - Plan: Ferritin, CBC  Vegetarian diet - Plan: B12, Folate  Here today for a pap and a couple of other concerns  She has noted restless leg-the left only- not painful cramping however.  Will screen for iron, B12 and folate deficiency for her Arrange for a screening mammo Her depression is under good control with celexa She still has some trouble sleeping, but does not need a RF of her ativan yet  See patient instructions for more details.     Signed Abbe Amsterdam, MD

## 2017-02-22 ENCOUNTER — Encounter (HOSPITAL_BASED_OUTPATIENT_CLINIC_OR_DEPARTMENT_OTHER): Payer: Self-pay

## 2017-02-22 ENCOUNTER — Ambulatory Visit (INDEPENDENT_AMBULATORY_CARE_PROVIDER_SITE_OTHER): Payer: BLUE CROSS/BLUE SHIELD | Admitting: Family Medicine

## 2017-02-22 ENCOUNTER — Ambulatory Visit (HOSPITAL_BASED_OUTPATIENT_CLINIC_OR_DEPARTMENT_OTHER)
Admission: RE | Admit: 2017-02-22 | Discharge: 2017-02-22 | Disposition: A | Discharge status: Home / Self Care | Payer: BLUE CROSS/BLUE SHIELD | Source: Ambulatory Visit | Attending: Family Medicine | Admitting: Family Medicine

## 2017-02-22 ENCOUNTER — Other Ambulatory Visit (HOSPITAL_COMMUNITY)
Admission: RE | Admit: 2017-02-22 | Discharge: 2017-02-22 | Disposition: A | Discharge status: Home / Self Care | Payer: BLUE CROSS/BLUE SHIELD | Source: Ambulatory Visit | Attending: Family Medicine | Admitting: Family Medicine

## 2017-02-22 VITALS — BP 110/80 | HR 78 | Temp 98.4°F | Ht 65.0 in | Wt 145.6 lb

## 2017-02-22 DIAGNOSIS — Z1239 Encounter for other screening for malignant neoplasm of breast: Secondary | ICD-10-CM

## 2017-02-22 DIAGNOSIS — R928 Other abnormal and inconclusive findings on diagnostic imaging of breast: Secondary | ICD-10-CM | POA: Insufficient documentation

## 2017-02-22 DIAGNOSIS — F329 Major depressive disorder, single episode, unspecified: Secondary | ICD-10-CM

## 2017-02-22 DIAGNOSIS — G2581 Restless legs syndrome: Secondary | ICD-10-CM

## 2017-02-22 DIAGNOSIS — Z1231 Encounter for screening mammogram for malignant neoplasm of breast: Secondary | ICD-10-CM

## 2017-02-22 DIAGNOSIS — Z124 Encounter for screening for malignant neoplasm of cervix: Secondary | ICD-10-CM

## 2017-02-22 DIAGNOSIS — F419 Anxiety disorder, unspecified: Secondary | ICD-10-CM | POA: Diagnosis not present

## 2017-02-22 DIAGNOSIS — F32A Depression, unspecified: Secondary | ICD-10-CM

## 2017-02-22 DIAGNOSIS — R921 Mammographic calcification found on diagnostic imaging of breast: Secondary | ICD-10-CM | POA: Diagnosis not present

## 2017-02-22 DIAGNOSIS — Z789 Other specified health status: Secondary | ICD-10-CM | POA: Diagnosis not present

## 2017-02-22 MED ORDER — CITALOPRAM HYDROBROMIDE 40 MG PO TABS: 40.0000 mg | ORAL_TABLET | Freq: Every day | ORAL | 3 refills | Status: DC

## 2017-02-22 MED FILL — CITALOPRAM HBR 40 MG TABLET: 40 | 90 days supply | Qty: 90 | Fill #3

## 2017-02-22 NOTE — Patient Instructions (Signed)
I will be in touch with your pap and labs, and will set up a baseline mammogram for you  I also refilled your celexa for a year

## 2017-02-24 ENCOUNTER — Other Ambulatory Visit: Payer: Self-pay | Admitting: Family Medicine

## 2017-02-24 DIAGNOSIS — R928 Other abnormal and inconclusive findings on diagnostic imaging of breast: Secondary | ICD-10-CM

## 2017-02-25 LAB — CYTOLOGY - PAP
DIAGNOSIS: NEGATIVE
HPV (WINDOPATH): NOT DETECTED

## 2017-02-28 ENCOUNTER — Encounter: Payer: Self-pay | Admitting: Family Medicine

## 2017-03-01 ENCOUNTER — Telehealth: Payer: Self-pay | Admitting: *Deleted

## 2017-03-01 NOTE — Telephone Encounter (Signed)
Received Physician Orders from [facility]; forwarded to provider/SLS 09/17  

## 2017-03-08 ENCOUNTER — Other Ambulatory Visit (INDEPENDENT_AMBULATORY_CARE_PROVIDER_SITE_OTHER): Payer: BLUE CROSS/BLUE SHIELD

## 2017-03-08 DIAGNOSIS — Z789 Other specified health status: Secondary | ICD-10-CM

## 2017-03-09 ENCOUNTER — Encounter: Payer: Self-pay | Admitting: Family Medicine

## 2017-03-09 ENCOUNTER — Other Ambulatory Visit: Payer: Self-pay | Admitting: Family Medicine

## 2017-03-09 DIAGNOSIS — E611 Iron deficiency: Secondary | ICD-10-CM

## 2017-03-09 LAB — CBC
HEMATOCRIT: 36.7 % (ref 36.0–46.0)
Hemoglobin: 12.1 g/dL (ref 12.0–15.0)
MCHC: 33 g/dL (ref 30.0–36.0)
MCV: 88.9 fl (ref 78.0–100.0)
Platelets: 235 10*3/uL (ref 150.0–400.0)
RBC: 4.13 Mil/uL (ref 3.87–5.11)
RDW: 16.7 % — ABNORMAL HIGH (ref 11.5–15.5)
WBC: 6.5 10*3/uL (ref 4.0–10.5)

## 2017-03-09 LAB — VITAMIN B12: VITAMIN B 12: 487 pg/mL (ref 211–911)

## 2017-03-09 LAB — FERRITIN: FERRITIN: 4.6 ng/mL — AB (ref 10.0–291.0)

## 2017-03-09 LAB — FOLATE: Folate: 11.9 ng/mL (ref >5.9)

## 2017-03-10 ENCOUNTER — Other Ambulatory Visit: Payer: Self-pay | Admitting: Family Medicine

## 2017-03-10 ENCOUNTER — Ambulatory Visit
Admission: RE | Admit: 2017-03-10 | Discharge: 2017-03-10 | Disposition: A | Discharge status: Home / Self Care | Payer: BLUE CROSS/BLUE SHIELD | Source: Ambulatory Visit | Attending: Family Medicine | Admitting: Family Medicine

## 2017-03-10 DIAGNOSIS — R928 Other abnormal and inconclusive findings on diagnostic imaging of breast: Secondary | ICD-10-CM

## 2017-03-10 DIAGNOSIS — R921 Mammographic calcification found on diagnostic imaging of breast: Secondary | ICD-10-CM

## 2017-03-15 MED FILL — LORazepam 1 MG TABS: 1 | 15 days supply | Qty: 30 | Fill #3

## 2017-04-15 MED FILL — LORazepam 1 MG TABS: 1 | 15 days supply | Qty: 30 | Fill #4

## 2017-05-14 MED FILL — CITALOPRAM HBR 40 MG TABLET: 40 | 90 days supply | Qty: 90 | Fill #0

## 2017-05-14 MED FILL — LORazepam 1 MG TABS: 1 | 15 days supply | Qty: 30 | Fill #5

## 2017-06-03 ENCOUNTER — Telehealth: Payer: Self-pay

## 2017-06-03 DIAGNOSIS — F329 Major depressive disorder, single episode, unspecified: Secondary | ICD-10-CM

## 2017-06-03 DIAGNOSIS — F32A Depression, unspecified: Secondary | ICD-10-CM

## 2017-06-03 DIAGNOSIS — F419 Anxiety disorder, unspecified: Principal | ICD-10-CM

## 2017-06-03 MED ORDER — LORAZEPAM 1 MG PO TABS: 1.0000 mg | ORAL_TABLET | Freq: Two times a day (BID) | ORAL | 3 refills | Status: DC

## 2017-06-03 NOTE — Telephone Encounter (Signed)
Requesting: LORAZEPAM 1 MG Contract: NO UDS: 08/25/16 Last OV: 02/22/2017 Next OV: 09/15/2017 Last Refill: 02/22/17   Please advise

## 2017-07-05 ENCOUNTER — Other Ambulatory Visit: Payer: Self-pay | Admitting: Family Medicine

## 2017-07-07 ENCOUNTER — Other Ambulatory Visit: Payer: Self-pay | Admitting: Emergency Medicine

## 2017-07-07 DIAGNOSIS — F32A Depression, unspecified: Secondary | ICD-10-CM

## 2017-07-07 DIAGNOSIS — F419 Anxiety disorder, unspecified: Principal | ICD-10-CM

## 2017-07-07 DIAGNOSIS — F329 Major depressive disorder, single episode, unspecified: Secondary | ICD-10-CM

## 2017-07-07 MED ORDER — LORAZEPAM 1 MG PO TABS: 1.0000 mg | ORAL_TABLET | Freq: Two times a day (BID) | ORAL | 1 refills | Status: DC

## 2017-07-07 NOTE — Telephone Encounter (Signed)
Received refill request for Lorazepam. Last office visit and refill 06/03/17. [facility] from [facility].

## 2017-07-15 ENCOUNTER — Encounter (HOSPITAL_BASED_OUTPATIENT_CLINIC_OR_DEPARTMENT_OTHER): Payer: Self-pay | Admitting: Emergency Medicine

## 2017-07-15 ENCOUNTER — Emergency Department (HOSPITAL_BASED_OUTPATIENT_CLINIC_OR_DEPARTMENT_OTHER)
Admission: EM | Admit: 2017-07-15 | Discharge: 2017-07-15 | Disposition: A | Discharge status: Home / Self Care | Payer: BLUE CROSS/BLUE SHIELD | Attending: Emergency Medicine | Admitting: Emergency Medicine

## 2017-07-15 ENCOUNTER — Other Ambulatory Visit: Payer: Self-pay

## 2017-07-15 DIAGNOSIS — K529 Noninfective gastroenteritis and colitis, unspecified: Secondary | ICD-10-CM

## 2017-07-15 DIAGNOSIS — Z87891 Personal history of nicotine dependence: Secondary | ICD-10-CM | POA: Insufficient documentation

## 2017-07-15 DIAGNOSIS — Z91040 Latex allergy status: Secondary | ICD-10-CM | POA: Insufficient documentation

## 2017-07-15 DIAGNOSIS — Z79899 Other long term (current) drug therapy: Secondary | ICD-10-CM | POA: Diagnosis not present

## 2017-07-15 DIAGNOSIS — R109 Unspecified abdominal pain: Secondary | ICD-10-CM | POA: Diagnosis present

## 2017-07-15 LAB — COMPREHENSIVE METABOLIC PANEL
ALBUMIN: 4.2 g/dL (ref 3.5–5.0)
ALK PHOS: 75 U/L (ref 38–126)
ALT: 14 U/L (ref 14–54)
AST: 39 U/L (ref 15–41)
Anion gap: 16 — ABNORMAL HIGH (ref 5–15)
BILIRUBIN TOTAL: 0.7 mg/dL (ref 0.3–1.2)
BUN: 10 mg/dL (ref 6–20)
CALCIUM: 9.7 mg/dL (ref 8.9–10.3)
CO2: 16 mmol/L — ABNORMAL LOW (ref 22–32)
CREATININE: 0.72 mg/dL (ref 0.44–1.00)
Chloride: 104 mmol/L (ref 101–111)
GFR calc Af Amer: >60 mL/min (ref >60)
GLUCOSE: 155 mg/dL — AB (ref 65–99)
POTASSIUM: 3.6 mmol/L (ref 3.5–5.1)
Sodium: 136 mmol/L (ref 135–145)
TOTAL PROTEIN: 7.6 g/dL (ref 6.5–8.1)

## 2017-07-15 LAB — CBC WITH DIFFERENTIAL/PLATELET
BASOS ABS: 0 10*3/uL (ref 0.0–0.1)
BASOS PCT: 0 %
Eosinophils Absolute: 0 10*3/uL (ref 0.0–0.7)
Eosinophils Relative: 0 %
HEMATOCRIT: 36.4 % (ref 36.0–46.0)
HEMOGLOBIN: 12.8 g/dL (ref 12.0–15.0)
LYMPHS PCT: 8 %
Lymphs Abs: 0.8 10*3/uL (ref 0.7–4.0)
MCH: 31.5 pg (ref 26.0–34.0)
MCHC: 35.2 g/dL (ref 30.0–36.0)
MCV: 89.7 fL (ref 78.0–100.0)
MONO ABS: 0.2 10*3/uL (ref 0.1–1.0)
Monocytes Relative: 2 %
NEUTROS ABS: 8.4 10*3/uL — AB (ref 1.7–7.7)
NEUTROS PCT: 90 %
Platelets: 252 10*3/uL (ref 150–400)
RBC: 4.06 MIL/uL (ref 3.87–5.11)
RDW: 13 % (ref 11.5–15.5)
WBC: 9.4 10*3/uL (ref 4.0–10.5)

## 2017-07-15 LAB — URINALYSIS, ROUTINE W REFLEX MICROSCOPIC
Bilirubin Urine: NEGATIVE
GLUCOSE, UA: NEGATIVE mg/dL
HGB URINE DIPSTICK: NEGATIVE
KETONES UR: 15 mg/dL — AB
LEUKOCYTES UA: NEGATIVE
Nitrite: NEGATIVE
PH: 8 (ref 5.0–8.0)
PROTEIN: NEGATIVE mg/dL
Specific Gravity, Urine: 1.02 (ref 1.005–1.030)

## 2017-07-15 LAB — PREGNANCY, URINE: Preg Test, Ur: NEGATIVE

## 2017-07-15 LAB — LIPASE, BLOOD: LIPASE: 22 U/L (ref 11–51)

## 2017-07-15 MED ORDER — SODIUM CHLORIDE 0.9 % IV BOLUS (SEPSIS)
1000.0000 mL | Freq: Once | INTRAVENOUS | Status: AC
  Administered 2017-07-15: 1000 mL via INTRAVENOUS

## 2017-07-15 MED ORDER — HALOPERIDOL LACTATE 5 MG/ML IJ SOLN
3.0000 mg | Freq: Once | INTRAMUSCULAR | Status: AC
  Administered 2017-07-15: 3 mg via INTRAVENOUS
  Filled 2017-07-15: qty 1

## 2017-07-15 MED ORDER — ONDANSETRON 4 MG PO TBDP: 4.0000 mg | ORAL_TABLET | Freq: Three times a day (TID) | ORAL | 0 refills | Status: DC | PRN

## 2017-07-15 MED ORDER — DICYCLOMINE HCL 20 MG PO TABS: 20.0000 mg | ORAL_TABLET | Freq: Two times a day (BID) | ORAL | 0 refills | Status: DC

## 2017-07-15 MED ORDER — DICYCLOMINE HCL 10 MG/ML IM SOLN
20.0000 mg | Freq: Once | INTRAMUSCULAR | Status: AC
  Administered 2017-07-15: 20 mg via INTRAMUSCULAR
  Filled 2017-07-15: qty 2

## 2017-07-15 MED FILL — DICYCLOMINE 20 MG TABLET: 20 | 10 days supply | Qty: 20 | Fill #0

## 2017-07-15 MED FILL — ONDANSETRON ODT 4 MG TABLET: 4 | 7 days supply | Qty: 20 | Fill #0

## 2017-07-15 NOTE — ED Provider Notes (Signed)
[facility] EMERGENCY DEPARTMENT Provider Note   CSN: [id number] Arrival date & time: 07/15/17  0920     History   Chief Complaint Chief Complaint  Patient presents with  . Abdominal Pain    HPI Vanessa Sampson is a 37 y.o. female.  HPI  37yo female with history of anxiety and depression presents with concern for nausea, vomiting and diarrhea. Reports symptoms began last night. Has had about 4-5 episodes of nonbloody diarrhea, and severe emesis, nonstop per pt and husband.  Reports unable to keep anything down.  Has been vomiting about every 30 minutes.  No fevers, no urinary symptoms, no discharge.  No recent abx.  No known sick contacts. Was handling meat the other night and tasting the cooking but husband and daughter did as well and didn't get sick. No recent travel. Has tingling bilateral upper and lower extremities.   Past Medical History:  Diagnosis Date  . Anxiety     Patient Active Problem List   Diagnosis Date Noted  . Visit for preventive health examination 03/26/2015  . Anxiety and depression 10/10/2014  . Chronic idiopathic constipation 10/10/2014    Past Surgical History:  Procedure Laterality Date  . APPENDECTOMY  2011  . WISDOM TOOTH EXTRACTION      OB History    No data available       Home Medications    Prior to Admission medications   Medication Sig Start Date End Date Taking? Authorizing Provider  citalopram (CELEXA) 40 MG tablet Take 1 tablet (40 mg total) by mouth daily. 02/22/17   Copland, Gwenlyn FoundJessica C, MD  dicyclomine (BENTYL) 20 MG tablet Take 1 tablet (20 mg total) by mouth 2 (two) times daily. 07/15/17   Alvira MondaySchlossman, Arnulfo Batson, MD  LORazepam (ATIVAN) 1 MG tablet Take 1 tablet (1 mg total) by mouth 2 (two) times daily. Please come in for an office visit 07/07/17   Copland, Gwenlyn FoundJessica C, MD  magnesium 30 MG tablet Take 30 mg by mouth 2 (two) times daily.    [provider]  Multiple Vitamins-Minerals (MULTIVITAMIN ADULT PO) Take 1  tablet by mouth daily. Women's Health    [provider]  ondansetron (ZOFRAN ODT) 4 MG disintegrating tablet Take 1 tablet (4 mg total) by mouth every 8 (eight) hours as needed for nausea or vomiting. 07/15/17   Alvira MondaySchlossman, Laurajean Hosek, MD    Family History Family History  Problem Relation Age of Onset  . Fibromyalgia Father   . Hypertension Maternal Grandmother     Social History Social History   Tobacco Use  . Smoking status: Former Games developermoker  . Smokeless tobacco: Never Used  Substance Use Topics  . Alcohol use: Yes    Alcohol/week: 0.0 oz  . Drug use: No     Allergies   Mosquito (culex pipiens) allergy skin test and Latex   Review of Systems Review of Systems  Constitutional: Positive for appetite change and fatigue. Negative for fever.  HENT: Negative for sore throat.   Eyes: Negative for visual disturbance.  Respiratory: Negative for cough and shortness of breath.   Cardiovascular: Negative for chest pain.  Gastrointestinal: Positive for abdominal pain, diarrhea, nausea and vomiting.  Genitourinary: Negative for difficulty urinating.  Musculoskeletal: Negative for back pain and neck pain.  Skin: Negative for rash.  Neurological: Positive for light-headedness. Negative for syncope, speech difficulty, weakness and headaches. Numbness: tingling.     Physical Exam Updated Vital Signs BP 106/69   Pulse 96   Temp 98.3 F (  36.8 C) (Oral)   Resp (!) 21   Ht 5\' 4"  (1.626 m)   Wt 63.5 kg (140 lb)   LMP 07/01/2017 (Approximate)   SpO2 100%   BMI 24.03 kg/m   Physical Exam  Constitutional: She is oriented to person, place, and time. She appears well-developed and well-nourished. No distress.  HENT:  Head: Normocephalic and atraumatic.  Eyes: Conjunctivae and EOM are normal.  Neck: Normal range of motion.  Cardiovascular: Normal rate, regular rhythm, normal heart sounds and intact distal pulses. Exam reveals no gallop and no friction rub.  No murmur  heard. Pulmonary/Chest: Effort normal and breath sounds normal. No respiratory distress. She has no wheezes. She has no rales.  Abdominal: Soft. She exhibits no distension. There is no tenderness. There is no guarding.  Musculoskeletal: She exhibits no edema or tenderness.  Neurological: She is alert and oriented to person, place, and time. She has normal strength. No cranial nerve deficit. Sensory deficit: reports tingling bilateral feet and hands. GCS eye subscore is 4. GCS verbal subscore is 5. GCS motor subscore is 6.  Skin: Skin is warm and dry. No rash noted. She is not diaphoretic. No erythema.  Nursing note and vitals reviewed.    ED Treatments / Results  Labs (all labs ordered are listed, but only abnormal results are displayed) Labs Reviewed  CBC WITH DIFFERENTIAL/PLATELET - Abnormal; Notable for the following components:      Result Value   Neutro Abs 8.4 (*)    All other components within normal limits  COMPREHENSIVE METABOLIC PANEL - Abnormal; Notable for the following components:   CO2 16 (*)    Glucose, Bld 155 (*)    Anion gap 16 (*)    All other components within normal limits  URINALYSIS, ROUTINE W REFLEX MICROSCOPIC - Abnormal; Notable for the following components:   Ketones, ur 15 (*)    All other components within normal limits  URINE CULTURE  LIPASE, BLOOD  PREGNANCY, URINE    EKG  EKG Interpretation None       Radiology No results found.  Procedures Procedures (including critical care time)  Medications Ordered in ED Medications  sodium chloride 0.9 % bolus 1,000 mL (0 mLs Intravenous Stopped 07/15/17 1057)  haloperidol lactate (HALDOL) injection 3 mg (3 mg Intravenous Given 07/15/17 1052)  dicyclomine (BENTYL) injection 20 mg (20 mg Intramuscular Given 07/15/17 1052)  sodium chloride 0.9 % bolus 1,000 mL (0 mLs Intravenous Stopped 07/15/17 1231)     Initial Impression / Assessment and Plan / ED Course  I have reviewed the triage vital signs  and the nursing notes.  Pertinent labs & imaging results that were available during my care of the patient were reviewed by me and considered in my medical decision making (see chart for details).    37yo female with history above presents with concern for nausea, vomiting and diarrhea. Abdominal exam benign, nonfocal, low suspicion for cholecystitis, diverticulitis. No recent abx, doubt CDiff.  Reports tingling in upper and lower extremities, has no weakness, at this time low suspicion for GBS.  Symptoms possibly secondary to hyperventilation in setting of n/v.  Labs show AG metabolic acidosis, has ketones in urine without glucose and no hx of DM. Suspect starvation ketoacidosis.  Given IV fluids and haldol with improvement in n/v and abdominal pain.  Given rx for zofran and bentyl. Suspect likely gastroenteritis.  Recommend continued hydration, PCP follow up. Patient discharged in stable condition with understanding of reasons to return.  Final Clinical Impressions(s) / ED Diagnoses   Final diagnoses:  Gastroenteritis    ED Discharge Orders        Ordered    ondansetron (ZOFRAN ODT) 4 MG disintegrating tablet  Every 8 hours PRN     07/15/17 1225    dicyclomine (BENTYL) 20 MG tablet  2 times daily     07/15/17 1226       Alvira Monday, MD 07/15/17 2013

## 2017-07-15 NOTE — ED Triage Notes (Signed)
Patient reports generalized abdominal pain, vomiting and diarrhea since last night.  Reports she feels dehydrated and now cannot feel her arms or legs.  Patient was able to transfer from wheelchair to bed with assistance.

## 2017-07-15 NOTE — ED Notes (Signed)
Patient aware UA needed.  States unable to provide at present.

## 2017-07-16 LAB — URINE CULTURE

## 2017-08-02 MED FILL — CITALOPRAM HBR 40 MG TABLET: 40 | 90 days supply | Qty: 90 | Fill #1

## 2017-08-03 ENCOUNTER — Encounter: Payer: Self-pay | Admitting: Family Medicine

## 2017-08-03 ENCOUNTER — Telehealth: Payer: Self-pay | Admitting: Family Medicine

## 2017-08-03 NOTE — Telephone Encounter (Signed)
Pt has refill on current rx

## 2017-08-03 NOTE — Telephone Encounter (Signed)
Pt  Is  Requesting a  Refill  Of ativan  1mg     Last  Fill  07/07/2017  LOV  02/22/2017   [facility]      Pt  States  She  Had  To cancel  appt for 08/05/2017 due to family  Issues  And the doctor is  Out all next  Week   Pt states  She  Has  Made  An  appt  For 08/19/2017 but  She  Is  Requesting a 30  Day to get her through until then

## 2017-08-03 NOTE — Telephone Encounter (Signed)
Copied from CRM [id number]. Topic: Quick Communication - Rx Refill/Question >> Aug 03, 2017  9:52 AM Crist InfanteHarrald, Kathy J wrote: Medication: LORazepam (ATIVAN) 1 MG tablet  Pt has made an appt for 3/07, but requesting a 30 day to get through until then. Pt had to cancel appt 08/05/17 due to family issue, and the dr is out all next week.  [facility] [place] - [address] [phone] (Phone) [phone] (Fax)

## 2017-08-03 NOTE — Telephone Encounter (Signed)
Pt requesting refill on lorazepam

## 2017-08-05 ENCOUNTER — Ambulatory Visit: Payer: BLUE CROSS/BLUE SHIELD | Admitting: Family Medicine

## 2017-08-18 NOTE — Progress Notes (Signed)
[facility] [address] [place], [place] [address] [phone]  Date:  08/19/2017   Name:  Vanessa Sampson   DOB:  [birth year]   MRN:  [id number]  PCP:  Vanessa Cables, MD    Chief Complaint: Follow-up (Pt here for medication f/u visit. Pt concerned with weight today. Today's weight 152 and last weight 145. ) and Medication Refill (Request refill on LORazepam. )   History of Present Illness:  Vanessa Sampson is a 37 y.o. very pleasant female patient who presents with the following:  Following up on medications today- 6 month visit I last saw her in September:  She feels like her celexa is working well for her- she takes it in the evening and this seems to work well She is not sleeping that great, but better than it was She does use the ativan at bedtime as needed but does not need this refilled today  She was in the ER in January with a GI illness NCCSR:she filled 30 ativan on 1/23- no unexpected entries.  Reviewed on 3/6  Today she states that she thinks what happened in January "was a panic attack that went to the gut" She has had this sort of thing in the past, but not in several years,  Has not occurred again   She feels like celexa is working well for her Use uses ativan on most days, but not every day  She is taking iron OTC- a natural type  Would like to check her level today  Wt Readings from Last 3 Encounters:  08/19/17 152 lb (68.9 kg)  07/15/17 140 lb (63.5 kg)  02/22/17 145 lb 9.6 oz (66 kg)   We also note that her weight today was higher than she had expected- she does not think she has gained weight, certainly had not noted 12 lbs   Patient Active Problem List   Diagnosis Date Noted  . Visit for preventive health examination 03/26/2015  . Anxiety and depression 10/10/2014  . Chronic idiopathic constipation 10/10/2014    Past Medical History:  Diagnosis Date  . Anxiety     Past Surgical  History:  Procedure Laterality Date  . APPENDECTOMY  2011  . WISDOM TOOTH EXTRACTION      Social History   Tobacco Use  . Smoking status: Former Games developer  . Smokeless tobacco: Never Used  Substance Use Topics  . Alcohol use: Yes    Alcohol/week: 0.0 oz  . Drug use: No    Family History  Problem Relation Age of Onset  . Fibromyalgia Father   . Hypertension Maternal Grandmother     Allergies  Allergen Reactions  . Mosquito (Culex Pipiens) Allergy Skin Test Swelling    Eye.  . Latex Rash    Medication list has been reviewed and updated.  Current Outpatient Medications on File Prior to Visit  Medication Sig Dispense Refill  . citalopram (CELEXA) 40 MG tablet Take 1 tablet (40 mg total) by mouth daily. 90 tablet 3  . Multiple Vitamins-Minerals (MULTIVITAMIN ADULT PO) Take 1 tablet by mouth daily. Women's Health     No current facility-administered medications on file prior to visit.     Review of Systems:  As per HPI- otherwise negative.   Physical Examination: Vitals:   08/19/17 1500  BP: 108/62  Pulse: 69  Temp: 97.8 F (36.6 C)  SpO2: 98%   Vitals:   08/19/17 1500  Weight: 152  lb (68.9 kg)  Height: 5\' 5"  (1.651 m)   Body mass index is 25.29 kg/m. Ideal Body Weight: Weight in (lb) to have BMI = 25: 149.9  GEN: WDWN, NAD, Non-toxic, A & O x 3, looks well HEENT: Atraumatic, Normocephalic. Neck supple. No masses, No LAD. Ears and Nose: No external deformity. CV: RRR, No M/G/R. No JVD. No thrill. No extra heart sounds. PULM: CTA B, no wheezes, crackles, rhonchi. No retractions. No resp. distress. No accessory muscle use. ABD: S, NT, ND, +BS. No rebound. No HSM. EXTR: No c/c/e NEURO Normal gait.  PSYCH: Normally interactive. Conversant. Not depressed or anxious appearing.  Calm demeanor.    Assessment and Plan: Iron deficiency - Plan: Ferritin  Anxiety and depression - Plan: LORazepam (ATIVAN) 1 MG tablet  Weight gain  Check ferritin Refilled  ativan Continue celexa We are not sure if she has truly gained weight or if this is just variation in time of day, clothing, etc.  She will monitor her weight at home and let me know if she has any concerns.    Signed Vanessa AmsterdamJessica Copland, MD

## 2017-08-19 ENCOUNTER — Encounter: Payer: Self-pay | Admitting: Family Medicine

## 2017-08-19 ENCOUNTER — Ambulatory Visit: Payer: BLUE CROSS/BLUE SHIELD | Admitting: Family Medicine

## 2017-08-19 VITALS — BP 108/62 | HR 69 | Temp 97.8°F | Ht 65.0 in | Wt 152.0 lb

## 2017-08-19 DIAGNOSIS — F419 Anxiety disorder, unspecified: Secondary | ICD-10-CM | POA: Diagnosis not present

## 2017-08-19 DIAGNOSIS — E611 Iron deficiency: Secondary | ICD-10-CM | POA: Diagnosis not present

## 2017-08-19 DIAGNOSIS — F329 Major depressive disorder, single episode, unspecified: Secondary | ICD-10-CM

## 2017-08-19 DIAGNOSIS — R635 Abnormal weight gain: Secondary | ICD-10-CM

## 2017-08-19 DIAGNOSIS — F32A Depression, unspecified: Secondary | ICD-10-CM

## 2017-08-19 MED ORDER — LORAZEPAM 1 MG PO TABS: 1.0000 mg | ORAL_TABLET | Freq: Two times a day (BID) | ORAL | 2 refills | Status: DC

## 2017-08-19 MED FILL — LORazepam 1 MG TABS: 1 | 15 days supply | Qty: 30 | Fill #0

## 2017-08-19 NOTE — Patient Instructions (Addendum)
I refilled your ativan today- let me know when you need more of this  We will check your iron level today  Check your weight at home on your usual scale- if you have any concerns about unexpected weight gain please alert me Take care!  We can plan to visit in about 6 months

## 2017-09-15 ENCOUNTER — Inpatient Hospital Stay: Admission: RE | Admit: 2017-09-15 | Payer: BLUE CROSS/BLUE SHIELD | Source: Ambulatory Visit

## 2017-09-23 MED FILL — LORazepam 1 MG TABS: 1 | 15 days supply | Qty: 30 | Fill #1

## 2017-09-27 ENCOUNTER — Encounter: Payer: Self-pay | Admitting: Family Medicine

## 2017-10-11 ENCOUNTER — Other Ambulatory Visit: Payer: Self-pay | Admitting: Family Medicine

## 2017-10-20 MED FILL — LORazepam 1 MG TABS: 1 | 15 days supply | Qty: 30 | Fill #2

## 2017-10-29 MED FILL — CITALOPRAM HBR 40 MG TABLET: 40 | 90 days supply | Qty: 90 | Fill #2

## 2017-11-22 MED FILL — LORazepam 1 MG TABS: 1 | 15 days supply | Qty: 30 | Fill #0

## 2018-01-06 ENCOUNTER — Other Ambulatory Visit: Payer: Self-pay | Admitting: Family Medicine

## 2018-01-06 DIAGNOSIS — F32A Depression, unspecified: Secondary | ICD-10-CM

## 2018-01-06 DIAGNOSIS — F329 Major depressive disorder, single episode, unspecified: Secondary | ICD-10-CM

## 2018-01-06 DIAGNOSIS — F419 Anxiety disorder, unspecified: Principal | ICD-10-CM

## 2018-01-06 MED FILL — LORazepam 1 MG TABS: 1 | 15 days supply | Qty: 30 | Fill #0

## 2018-01-06 NOTE — Telephone Encounter (Signed)
Requesting:Ativan Contract:06/25/15 -needs updates csc UDS:08/25/16 low risk-needs updated uds Last Visit:08/19/17 Next Visit:none Last Refill:08/19/17 r refills  Please Advise

## 2018-01-25 MED FILL — CITALOPRAM HBR 40 MG TABLET: 40 | 90 days supply | Qty: 90 | Fill #3

## 2018-02-10 MED FILL — LORazepam 1 MG TABS: 1 | 15 days supply | Qty: 30 | Fill #1

## 2018-03-07 ENCOUNTER — Encounter: Payer: Self-pay | Admitting: Family Medicine

## 2018-03-07 DIAGNOSIS — F329 Major depressive disorder, single episode, unspecified: Secondary | ICD-10-CM

## 2018-03-07 DIAGNOSIS — F419 Anxiety disorder, unspecified: Principal | ICD-10-CM

## 2018-03-07 DIAGNOSIS — F32A Depression, unspecified: Secondary | ICD-10-CM

## 2018-03-08 MED ORDER — LORAZEPAM 1 MG PO TABS: ORAL_TABLET | ORAL | 0 refills | Status: DC

## 2018-03-08 MED FILL — LORazepam 1 MG TABS: 1 | 15 days supply | Qty: 30 | Fill #0

## 2018-03-08 NOTE — Telephone Encounter (Signed)
02/10/2018  1  01/06/2018  Lorazepam 1 Mg Tablet  30.00 15 Je Cop  [id number]  Med (5269)  1/1 2.00 LME Comm Ins  [place]  01/06/2018  1  01/06/2018  Lorazepam 1 Mg Tablet  30.00 15 Je Cop  [id number]  Med (5269)  0/1 2.00 LME Comm Ins  [place]  11/22/2017  1  07/07/2017  Lorazepam 1 Mg Tablet  30.00 15 Je Cop  [id number]  Med (5269)  0/1 2.00 LME Comm Ins  [place]  10/20/2017  1  08/19/2017  Lorazepam 1 Mg Tablet  30.00 15 Je Cop  [id number]  Med (5269)  2/2 2.00 LME Comm Ins  [place]  09/23/2017  1  08/19/2017  Lorazepam 1 Mg Tablet  30.00 15 Je Cop  [id number]  Med (5269)  1/2 2.00 LME Comm Ins  [place]  08/19/2017  1  08/19/2017  Lorazepam 1 Mg Tablet  30.00 15 Je Cop  [id number]  Med (5269)  0/2 2.00 LME Comm Ins  [place]  07/07/2017  2  06/03/2017  Lorazepam 1 Mg Tablet  30.00 15 Je Cop  [id number]  Wal (8691)  1/3 2.00 LME Comm Ins  [place]  06/03/2017  2  06/03/2017  Lorazepam 1 Mg Tablet  30.00 15 Je Cop  [id number]  Wal [phone](8691)  0/3
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
## 2018-03-16 ENCOUNTER — Ambulatory Visit: Payer: BLUE CROSS/BLUE SHIELD | Admitting: Family Medicine

## 2018-03-16 ENCOUNTER — Encounter: Payer: Self-pay | Admitting: Family Medicine

## 2018-03-16 VITALS — BP 100/70 | HR 72 | Temp 98.3°F | Resp 16 | Ht 65.0 in | Wt 154.0 lb

## 2018-03-16 DIAGNOSIS — F439 Reaction to severe stress, unspecified: Secondary | ICD-10-CM

## 2018-03-16 DIAGNOSIS — F419 Anxiety disorder, unspecified: Secondary | ICD-10-CM

## 2018-03-16 DIAGNOSIS — F32A Depression, unspecified: Secondary | ICD-10-CM

## 2018-03-16 DIAGNOSIS — F329 Major depressive disorder, single episode, unspecified: Secondary | ICD-10-CM

## 2018-03-16 MED ORDER — LORAZEPAM 1 MG PO TABS: 1.0000 mg | ORAL_TABLET | Freq: Two times a day (BID) | ORAL | 1 refills | Status: DC

## 2018-03-16 NOTE — Patient Instructions (Addendum)
It was good to see you today - I am sorry for all you have been through recently!   Let's increase your monthly allotment of lorazepam for anxiety.  Do be cautious about your use of this medication as it is habit forming.  Use as little as you are able and be aware of sedation I gave you some info on our counseling dept if you are interested. Please let me know if you are not doing ok

## 2018-04-07 MED FILL — LORazepam 1 MG TABS: 1 | 23 days supply | Qty: 45 | Fill #0

## 2018-05-06 ENCOUNTER — Other Ambulatory Visit: Payer: Self-pay | Admitting: Family Medicine

## 2018-05-06 DIAGNOSIS — F419 Anxiety disorder, unspecified: Principal | ICD-10-CM

## 2018-05-06 DIAGNOSIS — F329 Major depressive disorder, single episode, unspecified: Secondary | ICD-10-CM

## 2018-05-06 DIAGNOSIS — F32A Depression, unspecified: Secondary | ICD-10-CM

## 2018-05-06 MED FILL — CITALOPRAM HBR 40 MG TABLET: 40 | 90 days supply | Qty: 90 | Fill #0

## 2018-05-11 MED FILL — LORazepam 1 MG TABS: 1 | 23 days supply | Qty: 45 | Fill #1

## 2018-05-30 ENCOUNTER — Encounter: Payer: Self-pay | Admitting: Family Medicine

## 2018-06-10 ENCOUNTER — Other Ambulatory Visit: Payer: Self-pay | Admitting: Family Medicine

## 2018-06-10 DIAGNOSIS — F32A Depression, unspecified: Secondary | ICD-10-CM

## 2018-06-10 DIAGNOSIS — F419 Anxiety disorder, unspecified: Principal | ICD-10-CM

## 2018-06-10 DIAGNOSIS — F329 Major depressive disorder, single episode, unspecified: Secondary | ICD-10-CM

## 2018-06-10 MED FILL — LORazepam 1 MG TABS: 1 | 23 days supply | Qty: 45 | Fill #0

## 2018-06-10 NOTE — Telephone Encounter (Signed)
Last seen in October Refill is due- ok to RF today 05/11/2018  1   03/16/2018  Lorazepam 1 Mg Tablet  45.00 23 Je Cop  [id number]  Med (5269)  1/1 1.96 LME Comm Ins  [place]  04/07/2018  1   03/16/2018  Lorazepam 1 Mg Tablet  45.00 23 Je Cop  [id number]  Med (5269)  0/1 1.96 LME Comm Ins  [place]  03/08/2018  1   03/08/2018  Lorazepam 1 Mg Tablet  30.00 15 Je Cop  [id number]  Med (5269)  0/0 2.00 LME Comm Ins  [place]  02/10/2018  1   01/06/2018  Lorazepam 1 Mg Tablet  30.00 15 Je Cop  [id number]  Med (5269)  1/1 2.00 LME Comm Ins  [place]  01/06/2018  1   01/06/2018  Lorazepam 1 Mg Tablet  30.00 15 Je Cop  [id number]  Med (5269)  0/1 2.00 LME Comm Ins    11/22/2017  1   07/07/2017  Lorazepam 1 Mg Tablet  30.00 15 Je Cop  [id number]  Med (5269)  0/1 2.00 LME Comm Ins    10/20/2017  1   08/19/2017  Lorazepam 1 Mg Tablet  30.00 15 Je Cop  [id number]  Med (5269)  2/2

## 2018-08-02 MED FILL — CITALOPRAM HBR 40 MG TABLET: 40 | 90 days supply | Qty: 90 | Fill #1

## 2018-08-02 MED FILL — LORazepam 1 MG TABS: 1 | 23 days supply | Qty: 45 | Fill #1

## 2018-08-26 ENCOUNTER — Encounter: Payer: Self-pay | Admitting: Family Medicine

## 2018-08-26 DIAGNOSIS — F329 Major depressive disorder, single episode, unspecified: Secondary | ICD-10-CM

## 2018-08-26 DIAGNOSIS — F419 Anxiety disorder, unspecified: Principal | ICD-10-CM

## 2018-08-26 DIAGNOSIS — F32A Depression, unspecified: Secondary | ICD-10-CM

## 2018-08-28 MED ORDER — LORAZEPAM 1 MG PO TABS: 1.0000 mg | ORAL_TABLET | Freq: Two times a day (BID) | ORAL | 1 refills | Status: DC

## 2018-08-29 MED FILL — LORazepam 1 MG TABS: 1 | 23 days supply | Qty: 45 | Fill #0

## 2018-09-03 ENCOUNTER — Encounter: Payer: Self-pay | Admitting: Family Medicine

## 2018-10-10 MED FILL — LORazepam 1 MG TABS: 1 | 23 days supply | Qty: 45 | Fill #1

## 2018-10-31 ENCOUNTER — Encounter: Payer: Self-pay | Admitting: Family Medicine

## 2018-10-31 DIAGNOSIS — F32A Depression, unspecified: Secondary | ICD-10-CM

## 2018-10-31 DIAGNOSIS — F329 Major depressive disorder, single episode, unspecified: Secondary | ICD-10-CM

## 2018-10-31 MED ORDER — LORAZEPAM 1 MG PO TABS: 1.0000 mg | ORAL_TABLET | Freq: Two times a day (BID) | ORAL | 1 refills | Status: DC

## 2018-10-31 MED FILL — LORazepam 1 MG TABS: 1 | 23 days supply | Qty: 45 | Fill #0

## 2018-11-02 MED FILL — CITALOPRAM HBR 40 MG TABLET: 40 | 90 days supply | Qty: 90 | Fill #2
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
## 2018-11-09 ENCOUNTER — Other Ambulatory Visit: Payer: Self-pay

## 2018-11-09 ENCOUNTER — Ambulatory Visit (INDEPENDENT_AMBULATORY_CARE_PROVIDER_SITE_OTHER): Payer: BLUE CROSS/BLUE SHIELD | Admitting: Family Medicine

## 2018-11-09 ENCOUNTER — Encounter: Payer: Self-pay | Admitting: Family Medicine

## 2018-11-09 DIAGNOSIS — F439 Reaction to severe stress, unspecified: Secondary | ICD-10-CM | POA: Diagnosis not present

## 2018-11-09 DIAGNOSIS — Z789 Other specified health status: Secondary | ICD-10-CM

## 2018-11-09 DIAGNOSIS — E611 Iron deficiency: Secondary | ICD-10-CM

## 2018-11-09 DIAGNOSIS — F32A Depression, unspecified: Secondary | ICD-10-CM

## 2018-11-09 DIAGNOSIS — F419 Anxiety disorder, unspecified: Secondary | ICD-10-CM

## 2018-11-09 DIAGNOSIS — F329 Major depressive disorder, single episode, unspecified: Secondary | ICD-10-CM

## 2018-11-09 DIAGNOSIS — Z131 Encounter for screening for diabetes mellitus: Secondary | ICD-10-CM

## 2018-11-09 DIAGNOSIS — Z13220 Encounter for screening for lipoid disorders: Secondary | ICD-10-CM

## 2018-11-09 NOTE — Patient Instructions (Signed)
It was great to talk with you today, please continue to keep me posted about any concerns or worsening of your symptoms I will watch out for your labs, and will be in touch when the results come in

## 2018-12-02 MED FILL — LORazepam 1 MG TABS: 1 | 23 days supply | Qty: 45 | Fill #1

## 2019-01-10 ENCOUNTER — Other Ambulatory Visit: Payer: Self-pay | Admitting: Family Medicine

## 2019-01-10 DIAGNOSIS — F32A Depression, unspecified: Secondary | ICD-10-CM

## 2019-01-10 DIAGNOSIS — F329 Major depressive disorder, single episode, unspecified: Secondary | ICD-10-CM

## 2019-01-10 MED FILL — LORAZEPAM 1 MG TABS: 1 | 22 days supply | Qty: 45 | Fill #0

## 2019-01-10 MED FILL — CITALOPRAM HBR 40 MG TABLET: 40 | 90 days supply | Qty: 90 | Fill #3

## 2019-02-09 MED FILL — LORAZEPAM 1 MG TABS: 1 | 23 days supply | Qty: 45 | Fill #1

## 2019-02-13 ENCOUNTER — Encounter: Payer: Self-pay | Admitting: Family Medicine
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
## 2019-02-16 ENCOUNTER — Encounter: Payer: BLUE CROSS/BLUE SHIELD | Admitting: Family Medicine

## 2019-02-16 ENCOUNTER — Encounter: Payer: Self-pay | Admitting: *Deleted

## 2019-02-16 DIAGNOSIS — Z0289 Encounter for other administrative examinations: Secondary | ICD-10-CM

## 2019-03-06 MED FILL — LORAZEPAM 1 MG TABS: 1 | 23 days supply | Qty: 45 | Fill #2

## 2019-03-28 ENCOUNTER — Other Ambulatory Visit: Payer: Self-pay | Admitting: Family Medicine

## 2019-03-28 DIAGNOSIS — F329 Major depressive disorder, single episode, unspecified: Secondary | ICD-10-CM

## 2019-03-28 DIAGNOSIS — F419 Anxiety disorder, unspecified: Secondary | ICD-10-CM

## 2019-03-28 DIAGNOSIS — F32A Depression, unspecified: Secondary | ICD-10-CM

## 2019-03-29 MED FILL — CITALOPRAM HBR 40 MG TABLET: 40 | 90 days supply | Qty: 90 | Fill #0

## 2019-03-31 NOTE — Progress Notes (Addendum)
[facility] [address], [address] [place], [place] [address] [phone] [phone]  Date:  04/03/2019   Name:  Vanessa RhymesValerie Sampson   DOB:  July 10, 1980   MRN:  [id number]  PCP:  Pearline Cablesopland, Aymee Fomby C, MD    Chief Complaint: Annual Exam   History of Present Illness:  Vanessa Sampson is a 38 y.o. very pleasant female patient who presents with the following:  Here today for routine physical Vanessa Sampson is generally healthy, except for anxiety and chronic idiopathic constipation  Pap 2018- normal  Tetanus and flu shot complete Most recent labs January 2019, catch up today- she is fasting except for a piece of chocolate this am History of low ferritin  She asks about covid 19 antibody screening  She has not been sick during the pandemic Her husband was tested and his ab was negative  She is handling the pandemic pretty well- she is taking celexa 40 and feels like it is working for her Her sleep is ok She uses lorazepam daily for her anxiety- she does need a refill today  Wt Readings from Last 3 Encounters:  04/03/19 160 lb (72.6 kg)  03/16/18 154 lb (69.9 kg)  08/19/17 152 lb (68.9 kg)   She takes a magnesium tablet that helps with her constipation   Mammo: she did have an abnormal basline in 2018 and did not follow-up as of yet  02/2017: IMPRESSION: Loosely grouped calcifications within the lower inner quadrant of the right breast, majority suspected to represent benign milk of calcium. Recommend follow-up right breast diagnostic mammogram in 6 months to ensure stability.  RECOMMENDATION: Right breast diagnostic mammogram in 6 months.  I have discussed the findings and recommendations with the patient. Results were also provided in writing at the conclusion of the visit. If applicable, a reminder letter will be sent to the patient regarding the next appointment.  BI-RADS CATEGORY  3: Probably benign.  I will schedule diag mammo for  her     Patient Active Problem List   Diagnosis Date Noted  . Visit for preventive health examination 03/26/2015  . Anxiety and depression 10/10/2014  . Chronic idiopathic constipation 10/10/2014    Past Medical History:  Diagnosis Date  . Anxiety     Past Surgical History:  Procedure Laterality Date  . APPENDECTOMY  2011  . WISDOM TOOTH EXTRACTION      Social History   Tobacco Use  . Smoking status: Former Games developermoker  . Smokeless tobacco: Never Used  Substance Use Topics  . Alcohol use: Yes    Alcohol/week: 0.0 standard drinks  . Drug use: No    Family History  Problem Relation Age of Onset  . Fibromyalgia Father   . Hypertension Maternal Grandmother     Allergies  Allergen Reactions  . Gluten Meal Swelling  . Mosquito (Culex Pipiens) Allergy Skin Test Swelling    Eye.  . Latex Rash    Medication list has been reviewed and updated.  Current Outpatient Medications on File Prior to Visit  Medication Sig Dispense Refill  . citalopram (CELEXA) 40 MG tablet TAKE 1 TABLET (40 MG TOTAL) BY MOUTH DAILY. 90 tablet 3  . LORazepam (ATIVAN) 1 MG tablet TAKE 1 TABLET (1 MG TOTAL) BY MOUTH 2 (TWO) TIMES DAILY. AS NEEDED FOR ANXIETY 45 tablet 2  . Multiple Vitamins-Minerals (MULTIVITAMIN ADULT PO) Take 1 tablet by mouth daily. Women's Health    . UNABLE TO FIND Med Name: Magnesium  Supplement    . UNABLE TO FIND Med Name: beet supplement for anemia     No current facility-administered medications on file prior to visit.     Review of Systems:  As per HPI- otherwise negative. No fever or chills No CP or OSB Trying to ride her exercise bike as often as she is able   Physical Examination: Vitals:   04/03/19 1031  BP: 100/70  Pulse: 87  Resp: 15  Temp: 97.8 F (36.6 C)  SpO2: 98%   Vitals:   04/03/19 1031  Weight: 160 lb (72.6 kg)  Height: 5\' 5"  (1.651 m)   Body mass index is 26.63 kg/m. Ideal Body Weight: Weight in (lb) to have BMI = 25: 149.9  GEN:  WDWN, NAD, Non-toxic, A & O x 3, mild overweight, she looks well  HEENT: Atraumatic, Normocephalic. Neck supple. No masses, No LAD. Ears and Nose: No external deformity. CV: RRR, No M/G/R. No JVD. No thrill. No extra heart sounds. PULM: CTA B, no wheezes, crackles, rhonchi. No retractions. No resp. distress. No accessory muscle use. ABD: S, NT, ND, +BS. No rebound. No HSM. EXTR: No c/c/e NEURO Normal gait.  PSYCH: Normally interactive. Conversant. Not depressed or anxious appearing.  Calm demeanor.    BP Readings from Last 3 Encounters:  04/03/19 100/70  03/16/18 100/70  08/19/17 108/62    Assessment and Plan: Physical exam  Anxiety and depression - Plan: LORazepam (ATIVAN) 1 MG tablet  Iron deficiency - Plan: CBC, Ferritin  Screening for hyperlipidemia - Plan: Lipid panel  Screening for diabetes mellitus - Plan: Comprehensive metabolic panel, Hemoglobin A1c  Screening for thyroid disorder - Plan: TSH  Abnormal mammogram - Plan: MM DIAG BREAST TOMO BILATERAL  CPE today Labs pending Ordered follow-up diag mammo Continue celexa and lorazepam Will plan further follow- up pending labs.   Signed 10/19/17, MD   Received her labs, message to pt Results for orders placed or performed in visit on 04/03/19  CBC  Result Value Ref Range   WBC 7.0 4.0 - 10.5 K/uL   RBC 4.26 3.87 - 5.11 Mil/uL   Platelets 214.0 150.0 - 400.0 K/uL   Hemoglobin 13.6 12.0 - 15.0 g/dL   HCT 04/05/19 79.8 - 92.1 %   MCV 94.3 78.0 - 100.0 fl   MCHC 33.9 30.0 - 36.0 g/dL   RDW 19.4 17.4 - 08.1 %  Comprehensive metabolic panel  Result Value Ref Range   Sodium 138 135 - 145 mEq/L   Potassium 4.7 3.5 - 5.1 mEq/L   Chloride 103 96 - 112 mEq/L   CO2 27 19 - 32 mEq/L   Glucose, Bld 91 70 - 99 mg/dL   BUN 8 6 - 23 mg/dL   Creatinine, Ser 44.8 0.40 - 1.20 mg/dL   Total Bilirubin 0.5 0.2 - 1.2 mg/dL   Alkaline Phosphatase 79 39 - 117 U/L   AST 14 0 - 37 U/L   ALT 11 0 - 35 U/L   Total Protein  6.7 6.0 - 8.3 g/dL   Albumin 4.3 3.5 - 5.2 g/dL   Calcium 9.2 8.4 - 1.85 mg/dL   GFR 63.1 49.70 mL/min  Hemoglobin A1c  Result Value Ref Range   Hgb A1c MFr Bld 5.8 4.6 - 6.5 %  Lipid panel  Result Value Ref Range   Cholesterol 229 (H) 0 - 200 mg/dL   Triglycerides >26.37 0.0 - 149.0 mg/dL   HDL 858.8 50.27 mg/dL   VLDL >74.12 0.0 -  40.0 mg/dL   LDL Cholesterol 165 (H) 0 - 99 mg/dL   Total CHOL/HDL Ratio 6    NonHDL 189.87   TSH  Result Value Ref Range   TSH 1.65 0.35 - 4.50 uIU/mL  Ferritin  Result Value Ref Range   Ferritin 16.0 10.0 - 291.0 ng/mL   A1c is stable- pre-diabetes will be added to list

## 2019-04-03 ENCOUNTER — Ambulatory Visit (INDEPENDENT_AMBULATORY_CARE_PROVIDER_SITE_OTHER): Payer: BLUE CROSS/BLUE SHIELD | Admitting: Family Medicine

## 2019-04-03 ENCOUNTER — Encounter: Payer: Self-pay | Admitting: Family Medicine

## 2019-04-03 ENCOUNTER — Other Ambulatory Visit: Payer: Self-pay

## 2019-04-03 VITALS — BP 100/70 | HR 87 | Temp 97.8°F | Resp 15 | Ht 65.0 in | Wt 160.0 lb

## 2019-04-03 DIAGNOSIS — Z1329 Encounter for screening for other suspected endocrine disorder: Secondary | ICD-10-CM

## 2019-04-03 DIAGNOSIS — Z0000 Encounter for general adult medical examination without abnormal findings: Secondary | ICD-10-CM

## 2019-04-03 DIAGNOSIS — F329 Major depressive disorder, single episode, unspecified: Secondary | ICD-10-CM

## 2019-04-03 DIAGNOSIS — F32A Depression, unspecified: Secondary | ICD-10-CM

## 2019-04-03 DIAGNOSIS — F419 Anxiety disorder, unspecified: Secondary | ICD-10-CM | POA: Diagnosis not present

## 2019-04-03 DIAGNOSIS — Z13220 Encounter for screening for lipoid disorders: Secondary | ICD-10-CM

## 2019-04-03 DIAGNOSIS — R928 Other abnormal and inconclusive findings on diagnostic imaging of breast: Secondary | ICD-10-CM

## 2019-04-03 DIAGNOSIS — Z131 Encounter for screening for diabetes mellitus: Secondary | ICD-10-CM

## 2019-04-03 DIAGNOSIS — E611 Iron deficiency: Secondary | ICD-10-CM | POA: Diagnosis not present

## 2019-04-03 DIAGNOSIS — R7303 Prediabetes: Secondary | ICD-10-CM

## 2019-04-03 LAB — CBC
HCT: 40.1 % (ref 36.0–46.0)
Hemoglobin: 13.6 g/dL (ref 12.0–15.0)
MCHC: 33.9 g/dL (ref 30.0–36.0)
MCV: 94.3 fl (ref 78.0–100.0)
Platelets: 214 10*3/uL (ref 150.0–400.0)
RBC: 4.26 Mil/uL (ref 3.87–5.11)
RDW: 13.1 % (ref 11.5–15.5)
WBC: 7 10*3/uL (ref 4.0–10.5)

## 2019-04-03 LAB — COMPREHENSIVE METABOLIC PANEL
ALT: 11 U/L (ref 0–35)
AST: 14 U/L (ref 0–37)
Albumin: 4.3 g/dL (ref 3.5–5.2)
Alkaline Phosphatase: 79 U/L (ref 39–117)
BUN: 8 mg/dL (ref 6–23)
CO2: 27 mEq/L (ref 19–32)
Calcium: 9.2 mg/dL (ref 8.4–10.5)
Chloride: 103 mEq/L (ref 96–112)
Creatinine, Ser: 0.74 mg/dL (ref 0.40–1.20)
GFR: 87.49 mL/min (ref >60.00)
Glucose, Bld: 91 mg/dL (ref 70–99)
Potassium: 4.7 mEq/L (ref 3.5–5.1)
Sodium: 138 mEq/L (ref 135–145)
Total Bilirubin: 0.5 mg/dL (ref 0.2–1.2)
Total Protein: 6.7 g/dL (ref 6.0–8.3)

## 2019-04-03 LAB — LIPID PANEL
Cholesterol: 229 mg/dL — ABNORMAL HIGH (ref 0–200)
HDL: 39.1 mg/dL (ref >39.00)
LDL Cholesterol: 165 mg/dL — ABNORMAL HIGH (ref 0–99)
NonHDL: 189.87
Total CHOL/HDL Ratio: 6
Triglycerides: 123 mg/dL (ref 0.0–149.0)
VLDL: 24.6 mg/dL (ref 0.0–40.0)

## 2019-04-03 LAB — HEMOGLOBIN A1C: Hgb A1c MFr Bld: 5.8 % (ref 4.6–6.5)

## 2019-04-03 LAB — TSH: TSH: 1.65 u[IU]/mL (ref 0.35–4.50)

## 2019-04-03 LAB — FERRITIN: Ferritin: 16 ng/mL (ref 10.0–291.0)

## 2019-04-03 MED ORDER — LORAZEPAM 1 MG PO TABS: 1.0000 mg | ORAL_TABLET | Freq: Two times a day (BID) | ORAL | 2 refills | Status: DC

## 2019-04-03 MED FILL — LORAZEPAM 1 MG TABS: 1 | 23 days supply | Qty: 45 | Fill #0

## 2019-04-03 NOTE — Patient Instructions (Signed)
Good to see you again today I will be in touch with your labs asap We will also set you up for a mammogram at the [facility] I refilled your lorazepam today  Take care   Health Maintenance, Female Adopting a healthy lifestyle and getting preventive care are important in promoting health and wellness. Ask your health care provider about:  The right schedule for you to have regular tests and exams.  Things you can do on your own to prevent diseases and keep yourself healthy. What should I know about diet, weight, and exercise? Eat a healthy diet   Eat a diet that includes plenty of vegetables, fruits, low-fat dairy products, and lean protein.  Do not eat a lot of foods that are high in solid fats, added sugars, or sodium. Maintain a healthy weight Body mass index (BMI) is used to identify weight problems. It estimates body fat based on height and weight. Your health care provider can help determine your BMI and help you achieve or maintain a healthy weight. Get regular exercise Get regular exercise. This is one of the most important things you can do for your health. Most adults should:  Exercise for at least 150 minutes each week. The exercise should increase your heart rate and make you sweat (moderate-intensity exercise).  Do strengthening exercises at least twice a week. This is in addition to the moderate-intensity exercise.  Spend less time sitting. Even light physical activity can be beneficial. Watch cholesterol and blood lipids Have your blood tested for lipids and cholesterol at 38 years of age, then have this test every 5 years. Have your cholesterol levels checked more often if:  Your lipid or cholesterol levels are high.  You are older than 38 years of age.  You are at high risk for heart disease. What should I know about cancer screening? Depending on your health history and family history, you may need to have cancer screening at various ages. This  may include screening for:  Breast cancer.  Cervical cancer.  Colorectal cancer.  Skin cancer.  Lung cancer. What should I know about heart disease, diabetes, and high blood pressure? Blood pressure and heart disease  High blood pressure causes heart disease and increases the risk of stroke. This is more likely to develop in people who have high blood pressure readings, are of African descent, or are overweight.  Have your blood pressure checked: ? Every 3-5 years if you are 25-106 years of age. ? Every year if you are 23 years old or older. Diabetes Have regular diabetes screenings. This checks your fasting blood sugar level. Have the screening done:  Once every three years after age 20 if you are at a normal weight and have a low risk for diabetes.  More often and at a younger age if you are overweight or have a high risk for diabetes. What should I know about preventing infection? Hepatitis B If you have a higher risk for hepatitis B, you should be screened for this virus. Talk with your health care provider to find out if you are at risk for hepatitis B infection. Hepatitis C Testing is recommended for:  Everyone born from [birth year] through 1965.  Anyone with known risk factors for hepatitis C. Sexually transmitted infections (STIs)  Get screened for STIs, including gonorrhea and chlamydia, if: ? You are sexually active and are younger than 38 years of age. ? You are older than 38 years of age and your health care  provider tells you that you are at risk for this type of infection. ? Your sexual activity has changed since you were last screened, and you are at increased risk for chlamydia or gonorrhea. Ask your health care provider if you are at risk.  Ask your health care provider about whether you are at high risk for HIV. Your health care provider may recommend a prescription medicine to help prevent HIV infection. If you choose to take medicine to prevent HIV, you should  first get tested for HIV. You should then be tested every 3 months for as long as you are taking the medicine. Pregnancy  If you are about to stop having your period (premenopausal) and you may become pregnant, seek counseling before you get pregnant.  Take 400 to 800 micrograms (mcg) of folic acid every day if you become pregnant.  Ask for birth control (contraception) if you want to prevent pregnancy. Osteoporosis and menopause Osteoporosis is a disease in which the bones lose minerals and strength with aging. This can result in bone fractures. If you are 64 years old or older, or if you are at risk for osteoporosis and fractures, ask your health care provider if you should:  Be screened for bone loss.  Take a calcium or vitamin D supplement to lower your risk of fractures.  Be given hormone replacement therapy (HRT) to treat symptoms of menopause. Follow these instructions at home: Lifestyle  Do not use any products that contain nicotine or tobacco, such as cigarettes, e-cigarettes, and chewing tobacco. If you need help quitting, ask your health care provider.  Do not use street drugs.  Do not share needles.  Ask your health care provider for help if you need support or information about quitting drugs. Alcohol use  Do not drink alcohol if: ? Your health care provider tells you not to drink. ? You are pregnant, may be pregnant, or are planning to become pregnant.  If you drink alcohol: ? Limit how much you use to 0-1 drink a day. ? Limit intake if you are breastfeeding.  Be aware of how much alcohol is in your drink. In the [place] one drink equals one 12 oz bottle of beer (355 mL), one 5 oz glass of wine (148 mL), or one 1 oz glass of hard liquor (44 mL). General instructions  Schedule regular health, dental, and eye exams.  Stay current with your vaccines.  Tell your health care provider if: ? You often feel depressed. ? You have ever been abused or do not feel safe  at home. Summary  Adopting a healthy lifestyle and getting preventive care are important in promoting health and wellness.  Follow your health care provider's instructions about healthy diet, exercising, and getting tested or screened for diseases.  Follow your health care provider's instructions on monitoring your cholesterol and blood pressure. This information is not intended to replace advice given to you by your health care provider. Make sure you discuss any questions you have with your health care provider. Document Released: 12/15/2010 Document Revised: 05/25/2018 Document Reviewed: 05/25/2018 Elsevier Patient Education  2020 [employer].

## 2019-05-17 MED FILL — LORAZEPAM 1 MG TABS: 1 | 23 days supply | Qty: 45 | Fill #1

## 2019-06-06 MED FILL — CITALOPRAM HBR 40 MG TABLET: 40 | 90 days supply | Qty: 90 | Fill #1

## 2019-06-12 MED FILL — LORAZEPAM 1 MG TABS: 1 | 23 days supply | Qty: 45 | Fill #2

## 2019-07-27 ENCOUNTER — Other Ambulatory Visit: Payer: Self-pay | Admitting: Family Medicine

## 2019-07-27 DIAGNOSIS — F329 Major depressive disorder, single episode, unspecified: Secondary | ICD-10-CM

## 2019-07-27 DIAGNOSIS — F32A Depression, unspecified: Secondary | ICD-10-CM

## 2019-07-27 MED FILL — LORAZEPAM 1 MG TABS: 1 | 23 days supply | Qty: 45 | Fill #0

## 2019-07-27 NOTE — Telephone Encounter (Signed)
Requesting: Lorazepam(Ativan) Contract:none RVA:CQPE Last Visit:04/03/2019 Next Visit:none Last Refill:04/03/2019  Please Advise

## 2019-09-11 MED FILL — LORazepam 1 MG TABS: 1 | 23 days supply | Qty: 45 | Fill #1

## 2019-09-11 MED FILL — CITALOPRAM HBR 40 MG TABLET: 40 | 90 days supply | Qty: 90 | Fill #2

## 2019-10-16 MED FILL — LORazepam 1 MG TABS: 1 | 23 days supply | Qty: 45 | Fill #2

## 2019-11-22 ENCOUNTER — Other Ambulatory Visit: Payer: Self-pay | Admitting: Family Medicine

## 2019-11-22 DIAGNOSIS — F32A Depression, unspecified: Secondary | ICD-10-CM

## 2019-11-22 DIAGNOSIS — F419 Anxiety disorder, unspecified: Secondary | ICD-10-CM

## 2019-11-23 NOTE — Telephone Encounter (Signed)
Requesting: ativan Contract:n/a UDS: n/a Last Visit:04/03/2019 Next Visit:n/a Last Refill:07/27/19  Please Advise

## 2019-12-18 MED FILL — CITALOPRAM HBR 40 MG TABLET: 40 | 90 days supply | Qty: 90 | Fill #3

## 2020-01-01 ENCOUNTER — Other Ambulatory Visit: Payer: Self-pay | Admitting: Family Medicine

## 2020-01-01 ENCOUNTER — Other Ambulatory Visit: Payer: Self-pay

## 2020-01-01 DIAGNOSIS — F419 Anxiety disorder, unspecified: Secondary | ICD-10-CM

## 2020-01-01 DIAGNOSIS — F32A Depression, unspecified: Secondary | ICD-10-CM

## 2020-01-01 MED FILL — LORAZEPAM 1 MG TABS: 1 | 23 days supply | Qty: 45 | Fill #0

## 2020-01-09 NOTE — Progress Notes (Signed)
[facility] at [facility] [address], [address] [place], [place] [address] [phone]  Date:  01/17/2020   Name:  Zelda Reames   DOB:  [birth year]   MRN:  [id number]  PCP:  Pearline Cables, MD    Chief Complaint: Medication Refill and Fall (right ankle pain, fell yesterday, swelling and bruising)   History of Present Illness:  Shanera Meske is a 39 y.o. very pleasant female patient who presents with the following:  Following up today- last seen by myself in October. Generally healthy, except for anxiety and chronic idiopathic constipation She fell and hurt her right ankle yesterday.  She missed a step and inverted her right ankle.  She was [place] in [place] the time, she had traveled to visit her father who was ill in the hospital. She had to wait a few minutes before she could walk, with some discomfort She has it in an ace bandage  She notes minimal swelling, no bruising  Hep C screening due HIV screening due covid series- done  Pap due September- last 2018, neg with neg HPV - she will schedule this soon, declines to update today Labs done in October- Can recheck A1c today   She had an abnormal mammo in 2018 and I don't think she has followed up- asked pt, she reports she DID have this followed up and all was ok.  I am unable to view this record RECOMMENDATION: Right breast diagnostic mammogram in 6 months.  I have discussed the findings and recommendations with the patient. Results were also provided in writing at the conclusion of the visit. If applicable, a reminder letter will be sent to the patient regarding the next appointment.  BI-RADS CATEGORY  3: Probably benign.  Patient Active Problem List   Diagnosis Date Noted  . Pre-diabetes 04/03/2019  . Visit for preventive health examination 03/26/2015  . Anxiety and depression 10/10/2014  . Chronic idiopathic constipation 10/10/2014    Past Medical History:   Diagnosis Date  . Anxiety     Past Surgical History:  Procedure Laterality Date  . APPENDECTOMY  2011  . WISDOM TOOTH EXTRACTION      Social History   Tobacco Use  . Smoking status: Former Games developer  . Smokeless tobacco: Never Used  Substance Use Topics  . Alcohol use: Yes    Alcohol/week: 0.0 standard drinks  . Drug use: No    Family History  Problem Relation Age of Onset  . Fibromyalgia Father   . Hypertension Maternal Grandmother     Allergies  Allergen Reactions  . Gluten Meal Swelling  . Mosquito (Culex Pipiens) Allergy Skin Test Swelling    Eye.  . Latex Rash    Medication list has been reviewed and updated.  Current Outpatient Medications on File Prior to Visit  Medication Sig Dispense Refill  . citalopram (CELEXA) 40 MG tablet TAKE 1 TABLET (40 MG TOTAL) BY MOUTH DAILY. 90 tablet 3  . LORazepam (ATIVAN) 1 MG tablet Take 1 tablet (1 mg total) by mouth 2 (two) times daily as needed for anxiety. No further fills without office visit please 45 tablet 0  . Multiple Vitamins-Minerals (MULTIVITAMIN ADULT PO) Take 1 tablet by mouth daily. Women's Health    . UNABLE TO FIND Med Name: Magnesium Supplement    . UNABLE TO FIND Med Name: beet supplement for anemia     No current facility-administered medications on file prior to visit.  Review of Systems:  As per HPI- otherwise negative.   Physical Examination: Vitals:   01/17/20 1113  BP: 106/60  Pulse: 90  Resp: 16  SpO2: 98%   Vitals:   01/17/20 1113  Weight: 159 lb (72.1 kg)  Height: 5\' 5"  (1.651 m)   Body mass index is 26.46 kg/m. Ideal Body Weight: Weight in (lb) to have BMI = 25: 149.9  GEN: no acute distress.  Minimal overweight, looks well HEENT: Atraumatic, Normocephalic.  Ears and Nose: No external deformity. CV: RRR, No M/G/R. No JVD. No thrill. No extra heart sounds. PULM: CTA B, no wheezes, crackles, rhonchi. No retractions. No resp. distress. No accessory muscle use. ABD: S, NT,  ND, +BS. No rebound. No HSM. EXTR: No c/c/e PSYCH: Normally interactive. Conversant.  When patient fell she caught herself on her right forearm.  No significant break in the skin, the elbow, wrist, hand are all okay, no evidence of fracture RIGHT ankle: Perhaps minimal swelling over the lateral malleolus, no bony tenderness including the fifth metatarsal.  No apparent bruising, Achilles is intact.  Range of motion is fine except for some discomfort with inversion of the foot. She is wearing an Ace bandage appropriately  Assessment and Plan: Sprain of right ankle, unspecified ligament, initial encounter  Iron deficiency  Screening for hyperlipidemia - Plan: Lipid panel  Pre-diabetes - Plan: Hemoglobin A1c  Screening for HIV (human immunodeficiency virus) - Plan: HIV Antibody (routine testing w rflx)  Encounter for hepatitis C screening test for low risk patient - Plan: Hepatitis C antibody  Chronic idiopathic constipation - Plan: Basic metabolic panel  Patient here today with a fairly mild right ankle sprain which occurred when she missed a step and inverted the right ankle yesterday.  I offered x-rays but advised patient that fracture risk seems low.  She declines x-rays at this time Encouraged ice as needed, compression as desired, elevation, take it easy until ankle is better.  I advised her that I expect this ankle sprain to resolve within a week or 2.  She will let me know if this is not the case Otherwise routine labs are pending as above Will plan further follow- up pending labs.' This visit occurred during the SARS-CoV-2 public health emergency.  Safety protocols were in place, including screening questions prior to the visit, additional usage of staff PPE, and extensive cleaning of exam room while observing appropriate contact time as indicated for disinfecting solutions.    Signed , MD

## 2020-01-17 ENCOUNTER — Ambulatory Visit: Payer: 59 | Admitting: Family Medicine

## 2020-01-17 ENCOUNTER — Encounter: Payer: Self-pay | Admitting: Family Medicine

## 2020-01-17 ENCOUNTER — Other Ambulatory Visit: Payer: Self-pay

## 2020-01-17 VITALS — BP 106/60 | HR 90 | Resp 16 | Ht 65.0 in | Wt 159.0 lb

## 2020-01-17 DIAGNOSIS — S93401A Sprain of unspecified ligament of right ankle, initial encounter: Secondary | ICD-10-CM

## 2020-01-17 DIAGNOSIS — F329 Major depressive disorder, single episode, unspecified: Secondary | ICD-10-CM | POA: Diagnosis not present

## 2020-01-17 DIAGNOSIS — Z13220 Encounter for screening for lipoid disorders: Secondary | ICD-10-CM | POA: Diagnosis not present

## 2020-01-17 DIAGNOSIS — R7303 Prediabetes: Secondary | ICD-10-CM | POA: Diagnosis not present

## 2020-01-17 DIAGNOSIS — K5904 Chronic idiopathic constipation: Secondary | ICD-10-CM

## 2020-01-17 DIAGNOSIS — F419 Anxiety disorder, unspecified: Secondary | ICD-10-CM | POA: Diagnosis not present

## 2020-01-17 DIAGNOSIS — Z1159 Encounter for screening for other viral diseases: Secondary | ICD-10-CM

## 2020-01-17 DIAGNOSIS — Z114 Encounter for screening for human immunodeficiency virus [HIV]: Secondary | ICD-10-CM

## 2020-01-17 DIAGNOSIS — E611 Iron deficiency: Secondary | ICD-10-CM | POA: Diagnosis not present

## 2020-01-17 LAB — LIPID PANEL
Cholesterol: 202 mg/dL — ABNORMAL HIGH (ref 0–200)
HDL: 45.3 mg/dL (ref >39.00)
LDL Cholesterol: 142 mg/dL — ABNORMAL HIGH (ref 0–99)
NonHDL: 156.41
Total CHOL/HDL Ratio: 4
Triglycerides: 70 mg/dL (ref 0.0–149.0)
VLDL: 14 mg/dL (ref 0.0–40.0)

## 2020-01-17 LAB — BASIC METABOLIC PANEL
BUN: 9 mg/dL (ref 6–23)
CO2: 24 mEq/L (ref 19–32)
Calcium: 9.2 mg/dL (ref 8.4–10.5)
Chloride: 104 mEq/L (ref 96–112)
Creatinine, Ser: 0.79 mg/dL (ref 0.40–1.20)
GFR: 80.8 mL/min (ref >60.00)
Glucose, Bld: 86 mg/dL (ref 70–99)
Potassium: 4.2 mEq/L (ref 3.5–5.1)
Sodium: 134 mEq/L — ABNORMAL LOW (ref 135–145)

## 2020-01-17 LAB — HEMOGLOBIN A1C: Hgb A1c MFr Bld: 5.6 % (ref 4.6–6.5)

## 2020-01-17 NOTE — Patient Instructions (Signed)
Good to see you today-  I hope that your dad is better soon!   You have an ankle sprain-  I think mild.  Please ice for 15 minutes a few times a day as needed for pain and swelling.  Your ace wrap can also be helpful.  Take it easy on your ankle until it feels better  I expect your ankle should be much better within a week or so- let me know if this is not the case!    Routine labs today Please see me for a pap at your convenience

## 2020-01-18 LAB — HEPATITIS C ANTIBODY
Hepatitis C Ab: NONREACTIVE
SIGNAL TO CUT-OFF: 0.01 (ref <1.00)

## 2020-01-18 LAB — HIV ANTIBODY (ROUTINE TESTING W REFLEX): HIV 1&2 Ab, 4th Generation: NONREACTIVE

## 2020-02-14 ENCOUNTER — Other Ambulatory Visit: Payer: Self-pay | Admitting: Family Medicine

## 2020-02-14 DIAGNOSIS — F32A Depression, unspecified: Secondary | ICD-10-CM

## 2020-02-14 DIAGNOSIS — F419 Anxiety disorder, unspecified: Secondary | ICD-10-CM

## 2020-02-14 NOTE — Telephone Encounter (Signed)
Orders have been placed for foot/ankle.  She can complete downstairs in imaging.

## 2020-02-15 MED FILL — LORazepam 1 MG TABS: 1 | 23 days supply | Qty: 45 | Fill #0

## 2020-02-20 ENCOUNTER — Ambulatory Visit (HOSPITAL_BASED_OUTPATIENT_CLINIC_OR_DEPARTMENT_OTHER)
Admission: RE | Admit: 2020-02-20 | Discharge: 2020-02-20 | Disposition: A | Discharge status: Home / Self Care | Payer: 59 | Source: Ambulatory Visit | Attending: Family | Admitting: Family

## 2020-02-20 ENCOUNTER — Other Ambulatory Visit: Payer: Self-pay

## 2020-02-20 DIAGNOSIS — S93401A Sprain of unspecified ligament of right ankle, initial encounter: Secondary | ICD-10-CM | POA: Insufficient documentation

## 2020-03-04 ENCOUNTER — Other Ambulatory Visit: Payer: Self-pay | Admitting: Family Medicine

## 2020-03-04 DIAGNOSIS — F32A Depression, unspecified: Secondary | ICD-10-CM

## 2020-03-04 MED FILL — CITALOPRAM HBR 40 MG TABLET: 40 | 90 days supply | Qty: 90 | Fill #0

## 2020-03-04 NOTE — Telephone Encounter (Signed)
Last written: 02/14/20 Last ov: 01/17/20 Next ov: 04/04/20 Contract:  UDS:

## 2020-03-05 MED FILL — LORazepam 1 MG TABS: 1 | 23 days supply | Qty: 45 | Fill #0

## 2020-04-02 NOTE — Progress Notes (Deleted)
[facility] [address], [address] [place], [place] [address] [phone]  Date:  04/04/2020   Name:  Vanessa Sampson   DOB:  [birth year]   MRN:  [id number]  PCP:  Pearline Cables, MD    Chief Complaint: No chief complaint on file.   History of Present Illness:  Vanessa Sampson is a 39 y.o. very pleasant female patient who presents with the following:  Pt here today for a CPE- history of pre-diabetes, constipation  Flu vaccine Pap- now due  covid series done Labs in August Lab Results  Component Value Date   HGBA1C 5.6 01/17/2020    Patient Active Problem List   Diagnosis Date Noted  . Pre-diabetes 04/03/2019  . Visit for preventive health examination 03/26/2015  . Anxiety and depression 10/10/2014  . Chronic idiopathic constipation 10/10/2014    Past Medical History:  Diagnosis Date  . Anxiety     Past Surgical History:  Procedure Laterality Date  . APPENDECTOMY  2011  . WISDOM TOOTH EXTRACTION      Social History   Tobacco Use  . Smoking status: Former Games developer  . Smokeless tobacco: Never Used  Substance Use Topics  . Alcohol use: Yes    Alcohol/week: 0.0 standard drinks  . Drug use: No    Family History  Problem Relation Age of Onset  . Fibromyalgia Father   . Hypertension Maternal Grandmother     Allergies  Allergen Reactions  . Gluten Meal Swelling  . Mosquito (Culex Pipiens) Allergy Skin Test Swelling    Eye.  . Latex Rash    Medication list has been reviewed and updated.  Current Outpatient Medications on File Prior to Visit  Medication Sig Dispense Refill  . citalopram (CELEXA) 40 MG tablet TAKE 1 TABLET BY MOUTH ONCE DAILY 90 tablet 3  . LORazepam (ATIVAN) 1 MG tablet TAKE 1 TABLET BY MOUTH TWICE DAILY AS NEEDED FOR ANXIETY 45 tablet 2  . Multiple Vitamins-Minerals (MULTIVITAMIN ADULT PO) Take 1 tablet by mouth daily. Women's Health    . UNABLE TO FIND Med Name: Magnesium  Supplement    . UNABLE TO FIND Med Name: beet supplement for anemia     No current facility-administered medications on file prior to visit.    Review of Systems:  As per HPI- otherwise negative.   Physical Examination: There were no vitals filed for this visit. There were no vitals filed for this visit. There is no height or weight on file to calculate BMI. Ideal Body Weight:    GEN: no acute distress. HEENT: Atraumatic, Normocephalic.  Ears and Nose: No external deformity. CV: RRR, No M/G/R. No JVD. No thrill. No extra heart sounds. PULM: CTA B, no wheezes, crackles, rhonchi. No retractions. No resp. distress. No accessory muscle use. ABD: S, NT, ND, +BS. No rebound. No HSM. EXTR: No c/c/e PSYCH: Normally interactive. Conversant.    Assessment and Plan: *** This visit occurred during the SARS-CoV-2 public health emergency.  Safety protocols were in place, including screening questions prior to the visit, additional usage of staff PPE, and extensive cleaning of exam room while observing appropriate contact time as indicated for disinfecting solutions.    Signed Abbe Amsterdam, MD

## 2020-04-04 ENCOUNTER — Encounter: Payer: 59 | Admitting: Family Medicine

## 2020-04-04 DIAGNOSIS — Z124 Encounter for screening for malignant neoplasm of cervix: Secondary | ICD-10-CM

## 2020-04-16 MED FILL — LORazepam 1 MG TABS: 1 | 23 days supply | Qty: 45 | Fill #1

## 2020-05-27 MED FILL — CITALOPRAM HBR 40 MG TABLET: 40 | 90 days supply | Qty: 90 | Fill #1

## 2020-05-27 MED FILL — LORazepam 1 MG TABS: 1 | 23 days supply | Qty: 45 | Fill #2

## 2020-06-27 ENCOUNTER — Other Ambulatory Visit: Payer: Self-pay | Admitting: Family Medicine

## 2020-06-27 DIAGNOSIS — F419 Anxiety disorder, unspecified: Secondary | ICD-10-CM

## 2020-06-27 DIAGNOSIS — F32A Depression, unspecified: Secondary | ICD-10-CM

## 2020-06-27 MED FILL — LORazepam 1 MG TABS: 1 | 23 days supply | Qty: 45 | Fill #0

## 2020-06-27 NOTE — Telephone Encounter (Signed)
Requesting:Ativan 1mg  Contract:06/25/15 UDS:08/24/16 Last Visit:01/17/20 Next Visit:n/a Last Refill:03/04/20  Please Advise

## 2020-07-10 ENCOUNTER — Encounter: Payer: 59 | Admitting: Family Medicine

## 2020-07-11 ENCOUNTER — Encounter: Payer: 59 | Admitting: Family Medicine

## 2020-07-14 NOTE — Progress Notes (Addendum)
[facility], [place], [place] [address] [phone]  Date:  07/18/2020   Name:  Vanessa Sampson   DOB:  [birth year]   MRN:  [id number]  PCP:  Darreld Mclean, MD    Chief Complaint: Annual Exam (Pap?)   History of Present Illness:  Vanessa Sampson is a 40 y.o. very pleasant female patient who presents with the following:  Vanessa Sampson is here today for physical exam Generally healthy, history of prediabetes, anxiety depression, constipation Married to Vanessa Sampson  Flu shot- done  Pap smear- do today  COVID-19 booster Be met and lipids done in August She has had an abnormal baseline mammogram in 2018, I do not believe she has followed up yet She is willing to follow-up today  She is doing well on her current dosages of citalopram and lorazepam   Patient Active Problem List   Diagnosis Date Noted  . Pre-diabetes 04/03/2019  . Visit for preventive health examination 03/26/2015  . Anxiety and depression 10/10/2014  . Chronic idiopathic constipation 10/10/2014    Past Medical History:  Diagnosis Date  . Anxiety     Past Surgical History:  Procedure Laterality Date  . APPENDECTOMY  2011  . WISDOM TOOTH EXTRACTION      Social History   Tobacco Use  . Smoking status: Former Research scientist (life sciences)  . Smokeless tobacco: Never Used  Substance Use Topics  . Alcohol use: Yes    Alcohol/week: 0.0 standard drinks  . Drug use: No    Family History  Problem Relation Age of Onset  . Fibromyalgia Father   . Hypertension Maternal Grandmother     Allergies  Allergen Reactions  . Gluten Meal Swelling  . Mosquito (Culex Pipiens) Allergy Skin Test Swelling    Eye.  . Latex Rash    Medication list has been reviewed and updated.  Current Outpatient Medications on File Prior to Visit  Medication Sig Dispense Refill  . citalopram (CELEXA) 40 MG tablet TAKE 1 TABLET BY MOUTH ONCE DAILY 90 tablet 3  . LORazepam (ATIVAN) 1  MG tablet TAKE 1 TABLET BY MOUTH TWICE DAILY AS NEEDED FOR ANXIETY 45 tablet 2  . Multiple Vitamins-Minerals (MULTIVITAMIN ADULT PO) Take 1 tablet by mouth daily. Women's Health    . UNABLE TO FIND Med Name: Magnesium Supplement    . UNABLE TO FIND Med Name: beet supplement for anemia     No current facility-administered medications on file prior to visit.    Review of Systems:  As per HPI- otherwise negative.   Physical Examination: Vitals:   07/18/20 0952  BP: 100/64  Pulse: 83  Resp: 16  SpO2: 100%   Vitals:   07/18/20 0952  Weight: 152 lb (68.9 kg)  Height: 5' 5"  (1.651 m)   Body mass index is 25.29 kg/m. Ideal Body Weight: Weight in (lb) to have BMI = 25: 149.9  GEN: no acute distress.  Looks well, normal weight  HEENT: Atraumatic, Normocephalic.  Ears and Nose: No external deformity. CV: RRR, No M/G/R. No JVD. No thrill. No extra heart sounds. PULM: CTA B, no wheezes, crackles, rhonchi. No retractions. No resp. distress. No accessory muscle use. ABD: S, NT, ND, +BS. No rebound. No HSM. EXTR: No c/c/e PSYCH: Normally interactive. Conversant.  Breast: normal exam, no masses/ dimpling/ discharge except she does have possible breast tissue right axilla.  Pt states this appeared after her daughter was born 15 years ago and  is stable  Pelvic: normal, no vaginal lesions or discharge. Uterus normal, no CMT, no adnexal tendereness or masses    Assessment and Plan: Physical exam  Screening for thyroid disorder - Plan: TSH  Pre-diabetes - Plan: Comprehensive metabolic panel, Hemoglobin A1c  Fatigue, unspecified type - Plan: TSH, VITAMIN D 25 Hydroxy (Vit-D Deficiency, Fractures)  Screening for deficiency anemia - Plan: CBC  History of abnormal mammogram - Plan: MM DIAG BREAST TOMO BILATERAL, US BREAST COMPLETE UNI RIGHT INC AXILLA  Screening for cervical cancer - Plan: Cytology - PAP  CPE- labs and mammo pending as above Encouraged healthy diet and exercise  routine Went over all health maint Will plan further follow- up pending labs.  This visit occurred during the SARS-CoV-2 public health emergency.  Safety protocols were in place, including screening questions prior to the visit, additional usage of staff PPE, and extensive cleaning of exam room while observing appropriate contact time as indicated for disinfecting solutions.    Signed Lamar Blinks, MD  Received her labs as below, message to patient  Results for orders placed or performed in visit on 07/18/20  CBC  Result Value Ref Range   WBC 5.7 4.0 - 10.5 K/uL   RBC 4.32 3.87 - 5.11 Mil/uL   Platelets 248.0 150.0 - 400.0 K/uL   Hemoglobin 13.7 12.0 - 15.0 g/dL   HCT 39.9 36.0 - 46.0 %   MCV 92.2 78.0 - 100.0 fl   MCHC 34.4 30.0 - 36.0 g/dL   RDW 13.7 11.5 - 15.5 %  Comprehensive metabolic panel  Result Value Ref Range   Sodium 135 135 - 145 mEq/L   Potassium 4.1 3.5 - 5.1 mEq/L   Chloride 101 96 - 112 mEq/L   CO2 28 19 - 32 mEq/L   Glucose, Bld 92 70 - 99 mg/dL   BUN 9 6 - 23 mg/dL   Creatinine, Ser 0.82 0.40 - 1.20 mg/dL   Total Bilirubin 0.6 0.2 - 1.2 mg/dL   Alkaline Phosphatase 86 39 - 117 U/L   AST 14 0 - 37 U/L   ALT 10 0 - 35 U/L   Total Protein 7.5 6.0 - 8.3 g/dL   Albumin 4.6 3.5 - 5.2 g/dL   GFR 89.72 >60.00 mL/min   Calcium 9.8 8.4 - 10.5 mg/dL  Hemoglobin A1c  Result Value Ref Range   Hgb A1c MFr Bld 5.6 4.6 - 6.5 %  TSH  Result Value Ref Range   TSH 2.39 0.35 - 4.50 uIU/mL  VITAMIN D 25 Hydroxy (Vit-D Deficiency, Fractures)  Result Value Ref Range   VITD 25.08 (L) 30.00 - 100.00 ng/mL   addnd 2/8- received her pap Reviewed per ASCCP guidelines- HPV screening in 3 years. Will share with pt   High risk HPV Negative   Adequacy Satisfactory for evaluation; transformation zone component PRESENT.   Diagnosis - Low grade squamous intraepithelial lesion (LSIL)Abnormal   Comment Normal Reference Range HPV - Negative    Last pap 2018- negative, HPV  negative

## 2020-07-14 NOTE — Patient Instructions (Addendum)
It was great to see you again today, I will be in touch your labs and pap as soon as possible We will set you up for a mammogram and right sided [facility] [place].  If this looks ok we can get back to routine screening mammogram here at the [facility] if you like    Health Maintenance, Female Adopting a healthy lifestyle and getting preventive care are important in promoting health and wellness. Ask your health care provider about:  The right schedule for you to have regular tests and exams.  Things you can do on your own to prevent diseases and keep yourself healthy. What should I know about diet, weight, and exercise? Eat a healthy diet  Eat a diet that includes plenty of vegetables, fruits, low-fat dairy products, and lean protein.  Do not eat a lot of foods that are high in solid fats, added sugars, or sodium.   Maintain a healthy weight Body mass index (BMI) is used to identify weight problems. It estimates body fat based on height and weight. Your health care provider can help determine your BMI and help you achieve or maintain a healthy weight. Get regular exercise Get regular exercise. This is one of the most important things you can do for your health. Most adults should:  Exercise for at least 150 minutes each week. The exercise should increase your heart rate and make you sweat (moderate-intensity exercise).  Do strengthening exercises at least twice a week. This is in addition to the moderate-intensity exercise.  Spend less time sitting. Even light physical activity can be beneficial. Watch cholesterol and blood lipids Have your blood tested for lipids and cholesterol at 40 years of age, then have this test every 5 years. Have your cholesterol levels checked more often if:  Your lipid or cholesterol levels are high.  You are older than 40 years of age.  You are at high risk for heart disease. What should I know about cancer  screening? Depending on your health history and family history, you may need to have cancer screening at various ages. This may include screening for:  Breast cancer.  Cervical cancer.  Colorectal cancer.  Skin cancer.  Lung cancer. What should I know about heart disease, diabetes, and high blood pressure? Blood pressure and heart disease  High blood pressure causes heart disease and increases the risk of stroke. This is more likely to develop in people who have high blood pressure readings, are of African descent, or are overweight.  Have your blood pressure checked: ? Every 3-5 years if you are 56-44 years of age. ? Every year if you are 46 years old or older. Diabetes Have regular diabetes screenings. This checks your fasting blood sugar level. Have the screening done:  Once every three years after age 72 if you are at a normal weight and have a low risk for diabetes.  More often and at a younger age if you are overweight or have a high risk for diabetes. What should I know about preventing infection? Hepatitis B If you have a higher risk for hepatitis B, you should be screened for this virus. Talk with your health care provider to find out if you are at risk for hepatitis B infection. Hepatitis C Testing is recommended for:  Everyone born from [birth year] through 1965.  Anyone with known risk factors for hepatitis C. Sexually transmitted infections (STIs)  Get screened for STIs, including gonorrhea and chlamydia, if: ? You are  sexually active and are younger than 40 years of age. ? You are older than 40 years of age and your health care provider tells you that you are at risk for this type of infection. ? Your sexual activity has changed since you were last screened, and you are at increased risk for chlamydia or gonorrhea. Ask your health care provider if you are at risk.  Ask your health care provider about whether you are at high risk for HIV. Your health care provider may  recommend a prescription medicine to help prevent HIV infection. If you choose to take medicine to prevent HIV, you should first get tested for HIV. You should then be tested every 3 months for as long as you are taking the medicine. Pregnancy  If you are about to stop having your period (premenopausal) and you may become pregnant, seek counseling before you get pregnant.  Take 400 to 800 micrograms (mcg) of folic acid every day if you become pregnant.  Ask for birth control (contraception) if you want to prevent pregnancy. Osteoporosis and menopause Osteoporosis is a disease in which the bones lose minerals and strength with aging. This can result in bone fractures. If you are 79 years old or older, or if you are at risk for osteoporosis and fractures, ask your health care provider if you should:  Be screened for bone loss.  Take a calcium or vitamin D supplement to lower your risk of fractures.  Be given hormone replacement therapy (HRT) to treat symptoms of menopause. Follow these instructions at home: Lifestyle  Do not use any products that contain nicotine or tobacco, such as cigarettes, e-cigarettes, and chewing tobacco. If you need help quitting, ask your health care provider.  Do not use street drugs.  Do not share needles.  Ask your health care provider for help if you need support or information about quitting drugs. Alcohol use  Do not drink alcohol if: ? Your health care provider tells you not to drink. ? You are pregnant, may be pregnant, or are planning to become pregnant.  If you drink alcohol: ? Limit how much you use to 0-1 drink a day. ? Limit intake if you are breastfeeding.  Be aware of how much alcohol is in your drink. In the [place] one drink equals one 12 oz bottle of beer (355 mL), one 5 oz glass of wine (148 mL), or one 1 oz glass of hard liquor (44 mL). General instructions  Schedule regular health, dental, and eye exams.  Stay current with your  vaccines.  Tell your health care provider if: ? You often feel depressed. ? You have ever been abused or do not feel safe at home. Summary  Adopting a healthy lifestyle and getting preventive care are important in promoting health and wellness.  Follow your health care provider's instructions about healthy diet, exercising, and getting tested or screened for diseases.  Follow your health care provider's instructions on monitoring your cholesterol and blood pressure. This information is not intended to replace advice given to you by your health care provider. Make sure you discuss any questions you have with your health care provider. Document Revised: 05/25/2018 Document Reviewed: 05/25/2018 Elsevier Patient Education  2021 [employer].

## 2020-07-18 ENCOUNTER — Other Ambulatory Visit: Payer: Self-pay

## 2020-07-18 ENCOUNTER — Ambulatory Visit (INDEPENDENT_AMBULATORY_CARE_PROVIDER_SITE_OTHER): Payer: 59 | Admitting: Family Medicine

## 2020-07-18 ENCOUNTER — Other Ambulatory Visit (HOSPITAL_COMMUNITY)
Admission: RE | Admit: 2020-07-18 | Discharge: 2020-07-18 | Disposition: A | Discharge status: Home / Self Care | Payer: 59 | Source: Ambulatory Visit | Attending: Family Medicine | Admitting: Family Medicine

## 2020-07-18 ENCOUNTER — Encounter: Payer: Self-pay | Admitting: Family Medicine

## 2020-07-18 VITALS — BP 100/64 | HR 83 | Resp 16 | Ht 65.0 in | Wt 152.0 lb

## 2020-07-18 DIAGNOSIS — R5383 Other fatigue: Secondary | ICD-10-CM | POA: Diagnosis not present

## 2020-07-18 DIAGNOSIS — Z124 Encounter for screening for malignant neoplasm of cervix: Secondary | ICD-10-CM

## 2020-07-18 DIAGNOSIS — Z87898 Personal history of other specified conditions: Secondary | ICD-10-CM

## 2020-07-18 DIAGNOSIS — Z0000 Encounter for general adult medical examination without abnormal findings: Secondary | ICD-10-CM | POA: Diagnosis not present

## 2020-07-18 DIAGNOSIS — Z130 Encounter for screening for diseases of the blood and blood-forming organs and certain disorders involving the immune mechanism: Secondary | ICD-10-CM

## 2020-07-18 DIAGNOSIS — Z1329 Encounter for screening for other suspected endocrine disorder: Secondary | ICD-10-CM

## 2020-07-18 DIAGNOSIS — R7303 Prediabetes: Secondary | ICD-10-CM | POA: Diagnosis not present

## 2020-07-18 LAB — COMPREHENSIVE METABOLIC PANEL
ALT: 10 U/L (ref 0–35)
AST: 14 U/L (ref 0–37)
Albumin: 4.6 g/dL (ref 3.5–5.2)
Alkaline Phosphatase: 86 U/L (ref 39–117)
BUN: 9 mg/dL (ref 6–23)
CO2: 28 mEq/L (ref 19–32)
Calcium: 9.8 mg/dL (ref 8.4–10.5)
Chloride: 101 mEq/L (ref 96–112)
Creatinine, Ser: 0.82 mg/dL (ref 0.40–1.20)
GFR: 89.72 mL/min (ref >60.00)
Glucose, Bld: 92 mg/dL (ref 70–99)
Potassium: 4.1 mEq/L (ref 3.5–5.1)
Sodium: 135 mEq/L (ref 135–145)
Total Bilirubin: 0.6 mg/dL (ref 0.2–1.2)
Total Protein: 7.5 g/dL (ref 6.0–8.3)

## 2020-07-18 LAB — CBC
HCT: 39.9 % (ref 36.0–46.0)
Hemoglobin: 13.7 g/dL (ref 12.0–15.0)
MCHC: 34.4 g/dL (ref 30.0–36.0)
MCV: 92.2 fl (ref 78.0–100.0)
Platelets: 248 10*3/uL (ref 150.0–400.0)
RBC: 4.32 Mil/uL (ref 3.87–5.11)
RDW: 13.7 % (ref 11.5–15.5)
WBC: 5.7 10*3/uL (ref 4.0–10.5)

## 2020-07-18 LAB — HEMOGLOBIN A1C: Hgb A1c MFr Bld: 5.6 % (ref 4.6–6.5)

## 2020-07-18 LAB — VITAMIN D 25 HYDROXY (VIT D DEFICIENCY, FRACTURES): VITD: 25.08 ng/mL — ABNORMAL LOW (ref 30.00–100.00)

## 2020-07-18 LAB — TSH: TSH: 2.39 u[IU]/mL (ref 0.35–4.50)

## 2020-07-22 LAB — CYTOLOGY - PAP
Comment: NEGATIVE
High risk HPV: NEGATIVE

## 2020-07-23 ENCOUNTER — Encounter: Payer: Self-pay | Admitting: Family Medicine

## 2020-09-10 ENCOUNTER — Encounter: Payer: Self-pay | Admitting: Family Medicine

## 2020-10-16 ENCOUNTER — Other Ambulatory Visit (HOSPITAL_BASED_OUTPATIENT_CLINIC_OR_DEPARTMENT_OTHER): Payer: Self-pay

## 2020-10-16 MED FILL — Lorazepam Tab 1 MG: ORAL | 23 days supply | Qty: 45 | Fill #0 | Status: AC

## 2020-11-18 ENCOUNTER — Other Ambulatory Visit (HOSPITAL_BASED_OUTPATIENT_CLINIC_OR_DEPARTMENT_OTHER): Payer: Self-pay

## 2020-11-18 ENCOUNTER — Other Ambulatory Visit: Payer: Self-pay | Admitting: Family Medicine

## 2020-11-18 DIAGNOSIS — F32A Depression, unspecified: Secondary | ICD-10-CM

## 2020-11-18 MED ORDER — LORAZEPAM 1 MG PO TABS
ORAL_TABLET | Freq: Two times a day (BID) | ORAL | 2 refills | Status: DC | PRN
  Filled 2020-11-18: qty 45, 23d supply, fill #0
  Filled 2020-12-19: qty 45, 23d supply, fill #1
  Filled 2021-01-19: qty 45, 23d supply, fill #2

## 2020-11-18 MED FILL — Citalopram Hydrobromide Tab 40 MG (Base Equiv): ORAL | 90 days supply | Qty: 90 | Fill #0 | Status: AC

## 2020-12-19 ENCOUNTER — Other Ambulatory Visit (HOSPITAL_BASED_OUTPATIENT_CLINIC_OR_DEPARTMENT_OTHER): Payer: Self-pay

## 2020-12-24 ENCOUNTER — Encounter: Payer: Self-pay | Admitting: Family Medicine

## 2021-01-02 ENCOUNTER — Encounter: Payer: Self-pay | Admitting: Family Medicine

## 2021-01-20 ENCOUNTER — Other Ambulatory Visit (HOSPITAL_BASED_OUTPATIENT_CLINIC_OR_DEPARTMENT_OTHER): Payer: Self-pay

## 2021-02-06 ENCOUNTER — Other Ambulatory Visit: Payer: Self-pay | Admitting: Family Medicine

## 2021-02-06 ENCOUNTER — Other Ambulatory Visit (HOSPITAL_BASED_OUTPATIENT_CLINIC_OR_DEPARTMENT_OTHER): Payer: Self-pay

## 2021-02-06 DIAGNOSIS — F32A Depression, unspecified: Secondary | ICD-10-CM

## 2021-02-06 MED ORDER — CITALOPRAM HYDROBROMIDE 40 MG PO TABS
ORAL_TABLET | Freq: Every day | ORAL | 1 refills | Status: DC
  Filled 2021-02-06: qty 90, 90d supply, fill #0

## 2021-02-19 ENCOUNTER — Other Ambulatory Visit: Payer: Self-pay | Admitting: Family Medicine

## 2021-02-19 DIAGNOSIS — F419 Anxiety disorder, unspecified: Secondary | ICD-10-CM

## 2021-02-19 DIAGNOSIS — F32A Depression, unspecified: Secondary | ICD-10-CM

## 2021-02-20 ENCOUNTER — Other Ambulatory Visit (HOSPITAL_BASED_OUTPATIENT_CLINIC_OR_DEPARTMENT_OTHER): Payer: Self-pay

## 2021-02-20 ENCOUNTER — Encounter: Payer: Self-pay | Admitting: Family Medicine

## 2021-02-20 MED ORDER — LORAZEPAM 1 MG PO TABS
ORAL_TABLET | Freq: Two times a day (BID) | ORAL | 0 refills | Status: DC | PRN
  Filled 2021-02-20: qty 45, 23d supply, fill #0

## 2021-02-21 NOTE — Progress Notes (Signed)
[facility] [address], [address] [place], [place] [address] [phone]  Date:  02/24/2021   Name:  Vanessa Sampson   DOB:  06/11/1981   MRN:  [id number]  PCP:  Pearline Cables, MD    Chief Complaint: Abdominal Pain (Discuss gallbladder concerns. )   History of Present Illness:  Vanessa Sampson is a 40 y.o. very pleasant female patient who presents with the following:  Pt seen today with concern about her gallbladder or some other source of chronic abdominal discomfort Generally healthy, history of prediabetes, anxiety depression, constipation Last visit with myself was in February for her CPE  History of anxiety, she does use chronic lorazepam   COVID-19 booster-discussed new bivalent option Flu shot-if today  Her husband has a vasectomy  LMP was about 2-3 weeks ago   Wt Readings from Last 3 Encounters:  02/24/21 155 lb (70.3 kg)  07/18/20 152 lb (68.9 kg)  01/17/20 159 lb (72.1 kg)   Today pt notes "problems with my gut for a long long time"-since she was a teen She may get pains which may be severe at times She thought she might have a gluten issue However, even when she avoids gluten she has the sx. she is currently following a gluten-free diet She did a sensitivity test per a "functional medicine" doctor; it showed that she was very sensitive to sunflowers, but also had sensitivities to several other foods, mostly vegetables  We discussed this testing.  I explained that in the absence of true allergic symptoms such as hives, rash, wheezing these "sensitivity tests" are of limited utility  She tends towards constipation instead of diarrhea No vomiting but she may have some nausea  She has more pain after eating  The pain can be over her whole belly, and she may have bloating to the point that she appears pregnant She feels very gassy Family members do tend to have the same sort of problem Patient Active  Problem List   Diagnosis Date Noted   Pre-diabetes 04/03/2019   Visit for preventive health examination 03/26/2015   Anxiety and depression 10/10/2014   Chronic idiopathic constipation 10/10/2014    Past Medical History:  Diagnosis Date   Anxiety     Past Surgical History:  Procedure Laterality Date   APPENDECTOMY  2011   WISDOM TOOTH EXTRACTION      Social History   Tobacco Use   Smoking status: Former   Smokeless tobacco: Never  Substance Use Topics   Alcohol use: Yes    Alcohol/week: 0.0 standard drinks   Drug use: No    Family History  Problem Relation Age of Onset   Fibromyalgia Father    Hypertension Maternal Grandmother     Allergies  Allergen Reactions   Gluten Meal Swelling   Mosquito (Culex Pipiens) Allergy Skin Test Swelling    Eye.   Latex Rash    Medication list has been reviewed and updated.  Current Outpatient Medications on File Prior to Visit  Medication Sig Dispense Refill   citalopram (CELEXA) 40 MG tablet TAKE 1 TABLET BY MOUTH ONCE DAILY 90 tablet 1   LORazepam (ATIVAN) 1 MG tablet TAKE 1 TABLET BY MOUTH TWICE DAILY AS NEEDED FOR ANXIETY 45 tablet 0   Multiple Vitamins-Minerals (MULTIVITAMIN ADULT PO) Take 1 tablet by mouth daily. Women's Health     UNABLE TO FIND Med Name: Magnesium Supplement     UNABLE TO FIND Med  Name: beet supplement for anemia     No current facility-administered medications on file prior to visit.    Review of Systems:  As per HPI- otherwise negative.   Physical Examination: Vitals:   02/24/21 1415  BP: 108/64  Pulse: 74  Resp: 16  Temp: 97.7 F (36.5 C)  SpO2: 99%   Vitals:   02/24/21 1415  Weight: 155 lb (70.3 kg)  Height: 5\' 5"  (1.651 m)   Body mass index is 25.79 kg/m. Ideal Body Weight: Weight in (lb) to have BMI = 25: 149.9  GEN: no acute distress.  Minimal overweight, looks well HEENT: Atraumatic, Normocephalic.  Ears and Nose: No external deformity. CV: RRR, No M/G/R. No JVD. No  thrill. No extra heart sounds. PULM: CTA B, no wheezes, crackles, rhonchi. No retractions. No resp. distress. No accessory muscle use. ABD: S, NT, ND, +BS. No rebound. No HSM.  Belly is benign EXTR: No c/c/e PSYCH: Normally interactive. Conversant.    Assessment and Plan: Bloating - Plan: Abdomen Limited RUQ (LIVER/GB)  Irritable bowel syndrome with constipation - Plan: Ambulatory referral to Gastroenterology, linaclotide (LINZESS) 145 MCG CAPS capsule  Immunization due - Plan: Flu Vaccine QUAD 6+ mos PF IM (Fluarix Quad PF)  Patient seen today with chronic bloating and likely irritable bowel-constipation.  However, also certainly consider etiologies such as celiac disease or Crohn's disease.  I will obtain a right upper quadrant ultrasound to check on her gallbladder.  She had a metabolic profile in February.  Also to repeat labs today-however, since her symptoms are quite longstanding she prefers to avoid another blood draw  Referral to GI for possible colonoscopy  Trial of Linzess IF we are able to get her insurance to pay for it  Flu shot of  This visit occurred during the SARS-CoV-2 public health emergency.  Safety protocols were in place, including screening questions prior to the visit, additional usage of staff PPE, and extensive cleaning of exam room while observing appropriate contact time as indicated for disinfecting solutions.   Signed March, MD

## 2021-02-24 ENCOUNTER — Ambulatory Visit (INDEPENDENT_AMBULATORY_CARE_PROVIDER_SITE_OTHER): Payer: 59 | Admitting: Family Medicine

## 2021-02-24 ENCOUNTER — Other Ambulatory Visit (HOSPITAL_BASED_OUTPATIENT_CLINIC_OR_DEPARTMENT_OTHER): Payer: Self-pay

## 2021-02-24 ENCOUNTER — Other Ambulatory Visit: Payer: Self-pay

## 2021-02-24 VITALS — BP 108/64 | HR 74 | Temp 97.7°F | Resp 16 | Ht 65.0 in | Wt 155.0 lb

## 2021-02-24 DIAGNOSIS — K581 Irritable bowel syndrome with constipation: Secondary | ICD-10-CM

## 2021-02-24 DIAGNOSIS — R140 Abdominal distension (gaseous): Secondary | ICD-10-CM | POA: Diagnosis not present

## 2021-02-24 DIAGNOSIS — Z23 Encounter for immunization: Secondary | ICD-10-CM | POA: Diagnosis not present

## 2021-02-24 MED ORDER — LINACLOTIDE 145 MCG PO CAPS
145.0000 ug | ORAL_CAPSULE | Freq: Every day | ORAL | 6 refills | Status: DC
  Filled 2021-02-24: qty 90, 90d supply, fill #0

## 2021-02-24 NOTE — Patient Instructions (Addendum)
Good to see you again today  Flu shot given  We will get you set up for an ultrasound of your gallbladder. We will try to get your insurance to cover Linzess for you - this may help if your issue is IBS constipation type  I will get you in to see GI about your concerns- you may need a colonoscopy to rule out celiac or chron's disease   Dr Loreta Ave- gastroenterology  Address: [address], [place], [place] [address] Phone: [phone] If you are getting much worse or any major changes in the meantime please let me know!

## 2021-02-27 ENCOUNTER — Other Ambulatory Visit: Payer: Self-pay

## 2021-02-27 ENCOUNTER — Ambulatory Visit (HOSPITAL_BASED_OUTPATIENT_CLINIC_OR_DEPARTMENT_OTHER)
Admission: RE | Admit: 2021-02-27 | Discharge: 2021-02-27 | Disposition: A | Discharge status: Home / Self Care | Payer: 59 | Source: Ambulatory Visit | Attending: Family Medicine | Admitting: Family Medicine

## 2021-02-27 DIAGNOSIS — R140 Abdominal distension (gaseous): Secondary | ICD-10-CM | POA: Diagnosis not present

## 2021-02-28 ENCOUNTER — Encounter: Payer: Self-pay | Admitting: Family Medicine

## 2021-03-05 ENCOUNTER — Encounter: Payer: Self-pay | Admitting: Family Medicine

## 2021-03-29 NOTE — Progress Notes (Signed)
[facility] [address], [address] [place], [place] [address] [phone]  Date:  03/31/2021   Name:  Vanessa Sampson   DOB:  [birth year]   MRN:  [id number]  PCP:  Pearline Cables, MD    Chief Complaint: No chief complaint on file.   History of Present Illness:  Vanessa Sampson is a 40 y.o. very pleasant female patient who presents with the following:  Virtual visit today to discuss medications Pt location is home and I am at office Patient identity confirmed with 2 factors, the patient and myself are present on the call today She wast told that she needs a visit for her lorazepam rx -however, she was actually last seen by myself a month ago- with concern of abd bloating  I did a RUQ [place] and referred to GI - RUQ negative   No concern for lorazepam, she just needs a refill.  Most recent physical was in February     Patient Active Problem List   Diagnosis Date Noted   Pre-diabetes 04/03/2019   Visit for preventive health examination 03/26/2015   Anxiety and depression 10/10/2014   Chronic idiopathic constipation 10/10/2014    Past Medical History:  Diagnosis Date   Anxiety     Past Surgical History:  Procedure Laterality Date   APPENDECTOMY  2011   WISDOM TOOTH EXTRACTION      Social History   Tobacco Use   Smoking status: Former   Smokeless tobacco: Never  Substance Use Topics   Alcohol use: Yes    Alcohol/week: 0.0 standard drinks   Drug use: No    Family History  Problem Relation Age of Onset   Fibromyalgia Father    Hypertension Maternal Grandmother     Allergies  Allergen Reactions   Gluten Meal Swelling   Mosquito (Culex Pipiens) Allergy Skin Test Swelling    Eye.   Latex Rash    Medication list has been reviewed and updated.  Current Outpatient Medications on File Prior to Visit  Medication Sig Dispense Refill   citalopram (CELEXA) 40 MG tablet TAKE 1 TABLET BY MOUTH ONCE DAILY 90  tablet 1   linaclotide (LINZESS) 145 MCG CAPS capsule Take 1 capsule (145 mcg total) by mouth daily before breakfast. 30 capsule 6   LORazepam (ATIVAN) 1 MG tablet TAKE 1 TABLET BY MOUTH TWICE DAILY AS NEEDED FOR ANXIETY 45 tablet 0   Multiple Vitamins-Minerals (MULTIVITAMIN ADULT PO) Take 1 tablet by mouth daily. Women's Health     UNABLE TO FIND Med Name: Magnesium Supplement     UNABLE TO FIND Med Name: beet supplement for anemia     No current facility-administered medications on file prior to visit.    Review of Systems:  As per HPI- otherwise negative.   Physical Examination: There were no vitals filed for this visit. There were no vitals filed for this visit. There is no height or weight on file to calculate BMI. Ideal Body Weight:   Patient observed via video monitor.  She looks well, her normal self GEN: no acute distress.  Assessment and Plan: Anxiety and depression - Plan: LORazepam (ATIVAN) 1 MG tablet  Refill lorazepam today.  No charge, visit was not actually necessary as I saw patient last month  Signed Abbe Amsterdam, MD

## 2021-03-31 ENCOUNTER — Telehealth: Payer: 59 | Admitting: Family Medicine

## 2021-03-31 ENCOUNTER — Other Ambulatory Visit: Payer: Self-pay

## 2021-03-31 DIAGNOSIS — F419 Anxiety disorder, unspecified: Secondary | ICD-10-CM

## 2021-03-31 DIAGNOSIS — F32A Anxiety disorder, unspecified: Secondary | ICD-10-CM

## 2021-03-31 MED ORDER — LORAZEPAM 1 MG PO TABS: ORAL_TABLET | Freq: Two times a day (BID) | ORAL | 3 refills | Status: AC | PRN

## 2021-04-11 ENCOUNTER — Encounter: Payer: Self-pay | Admitting: Family Medicine

## 2021-04-29 ENCOUNTER — Other Ambulatory Visit (HOSPITAL_BASED_OUTPATIENT_CLINIC_OR_DEPARTMENT_OTHER): Payer: Self-pay

## 2021-05-28 ENCOUNTER — Encounter: Payer: Self-pay | Admitting: Family Medicine

## 2021-06-18 ENCOUNTER — Ambulatory Visit: Payer: Self-pay | Admitting: Family Medicine

## 2021-06-27 ENCOUNTER — Encounter: Payer: Self-pay | Admitting: Family Medicine

## 2021-06-27 DIAGNOSIS — F32A Depression, unspecified: Secondary | ICD-10-CM

## 2021-06-27 DIAGNOSIS — F419 Anxiety disorder, unspecified: Secondary | ICD-10-CM

## 2021-06-28 MED ORDER — CITALOPRAM HYDROBROMIDE 40 MG PO TABS
ORAL_TABLET | Freq: Every day | ORAL | 1 refills | Status: DC
  Filled 2021-06-28: qty 90, 90d supply, fill #0
  Filled 2021-09-17: qty 90, 90d supply, fill #1

## 2021-06-30 ENCOUNTER — Other Ambulatory Visit (HOSPITAL_BASED_OUTPATIENT_CLINIC_OR_DEPARTMENT_OTHER): Payer: Self-pay

## 2021-07-02 ENCOUNTER — Encounter: Payer: Self-pay | Admitting: Family Medicine

## 2021-07-03 ENCOUNTER — Encounter: Payer: Self-pay | Admitting: Family Medicine

## 2021-07-03 NOTE — Telephone Encounter (Signed)
Is this possible?

## 2021-07-04 MED ORDER — LORAZEPAM 1 MG PO TABS: 1.0000 mg | ORAL_TABLET | Freq: Two times a day (BID) | ORAL | 0 refills | Status: DC | PRN

## 2021-08-08 ENCOUNTER — Encounter: Payer: Self-pay | Admitting: Family Medicine

## 2021-08-08 ENCOUNTER — Ambulatory Visit (INDEPENDENT_AMBULATORY_CARE_PROVIDER_SITE_OTHER): Payer: 59 | Admitting: Family Medicine

## 2021-08-08 VITALS — BP 96/78 | HR 79 | Temp 98.5°F | Resp 18 | Ht 65.0 in | Wt 150.2 lb

## 2021-08-08 DIAGNOSIS — N390 Urinary tract infection, site not specified: Secondary | ICD-10-CM

## 2021-08-08 DIAGNOSIS — R631 Polydipsia: Secondary | ICD-10-CM

## 2021-08-08 DIAGNOSIS — R319 Hematuria, unspecified: Secondary | ICD-10-CM | POA: Diagnosis not present

## 2021-08-08 DIAGNOSIS — R110 Nausea: Secondary | ICD-10-CM

## 2021-08-08 DIAGNOSIS — R42 Dizziness and giddiness: Secondary | ICD-10-CM

## 2021-08-08 LAB — POCT URINE PREGNANCY: Preg Test, Ur: NEGATIVE

## 2021-08-08 LAB — POC URINALSYSI DIPSTICK (AUTOMATED)
Bilirubin, UA: NEGATIVE
Glucose, UA: NEGATIVE
Ketones, UA: NEGATIVE
Leukocytes, UA: NEGATIVE
Nitrite, UA: NEGATIVE
Protein, UA: NEGATIVE
Spec Grav, UA: 1.02 (ref 1.010–1.025)
Urobilinogen, UA: 0.2 E.U./dL
pH, UA: 6 (ref 5.0–8.0)

## 2021-08-08 LAB — GLUCOSE, POCT (MANUAL RESULT ENTRY): POC Glucose: 92 mg/dl (ref 70–99)

## 2021-08-08 NOTE — Progress Notes (Signed)
Subjective:   By signing my name below, I, Vanessa Sampson, attest that this documentation has been prepared under the direction and in the presence of Vanessa Held, DO. 08/08/2021    Patient ID: Vanessa Sampson, female    DOB: 15-Oct-1980, 41 y.o.   MRN: [id number]  Chief Complaint  Patient presents with   Urinary Tract Infection    HPI Patient is in today for an office visit. She is accompanied by her husband.  She had a lab test done yesterday during vacation in [place] She was experiencing fever, dizziness, nausea and vomiting since Monday. She was given IV fluids in the hospital that helped her regain her strength. She has been able to eat but is still dizzy and excessively thirsty. Also complaining of abdominal pressure, lower back pain and excessive thirst.  She has never had a UTI or been diagnosed with diabetes. She has a history of anemia but does not take iron supplements. She eats iron-rich foods and takes multivitamins.  Urine dip is negative. Her blood sugar is below 100.  She was given cipro in the hospital and she will like to keep taking it.   Past Medical History:  Diagnosis Date   Anxiety     Past Surgical History:  Procedure Laterality Date   APPENDECTOMY  2011   WISDOM TOOTH EXTRACTION      Family History  Problem Relation Age of Onset   Fibromyalgia Father    Hypertension Maternal Grandmother     Social History   Socioeconomic History   Marital status: Married    Spouse name: Not on file   Number of children: Not on file   Years of education: Not on file   Highest education level: Not on file  Occupational History   Not on file  Tobacco Use   Smoking status: Former   Smokeless tobacco: Never  Substance and Sexual Activity   Alcohol use: Yes    Alcohol/week: 0.0 standard drinks   Drug use: No   Sexual activity: Yes    Partners: Male  Other Topics Concern   Not on file  Social History Narrative   Not on file    Social Determinants of Health   Financial Resource Strain: Not on file  Food Insecurity: Not on file  Transportation Needs: Not on file  Physical Activity: Not on file  Stress: Not on file  Social Connections: Not on file  Intimate Partner Violence: Not on file    Outpatient Medications Prior to Visit  Medication Sig Dispense Refill   citalopram (CELEXA) 40 MG tablet TAKE 1 TABLET BY MOUTH ONCE DAILY 90 tablet 1   linaclotide (LINZESS) 145 MCG CAPS capsule Take 1 capsule (145 mcg total) by mouth daily before breakfast. 30 capsule 6   LORazepam (ATIVAN) 1 MG tablet TAKE 1 TABLET BY MOUTH TWICE DAILY AS NEEDED FOR ANXIETY 45 tablet 3   LORazepam (ATIVAN) 1 MG tablet Take 1 tablet (1 mg total) by mouth 2 (two) times daily as needed for anxiety. 45 tablet 0   Multiple Vitamins-Minerals (MULTIVITAMIN ADULT PO) Take 1 tablet by mouth daily. Women's Health     UNABLE TO FIND Med Name: Magnesium Supplement     UNABLE TO FIND Med Name: beet supplement for anemia     No facility-administered medications prior to visit.    Allergies  Allergen Reactions   Gluten Meal Swelling   Mosquito (Culex Pipiens) Allergy Skin Test Swelling    Eye.  Latex Rash    Review of Systems  Constitutional:  Negative for fever.  HENT:  Negative for congestion, ear pain, hearing loss, sinus pain and sore throat.   Eyes:  Negative for blurred vision and pain.  Respiratory:  Negative for cough, sputum production, shortness of breath and wheezing.   Cardiovascular:  Negative for chest pain and palpitations.  Gastrointestinal:  Positive for abdominal pain. Negative for blood in stool, constipation, diarrhea, nausea and vomiting.  Genitourinary:  Negative for dysuria, frequency, hematuria and urgency.  Musculoskeletal:  Positive for back pain. Negative for falls and myalgias.  Neurological:  Positive for dizziness. Negative for sensory change, loss of consciousness, weakness and headaches.   Endo/Heme/Allergies:  Positive for polydipsia. Negative for environmental allergies. Does not bruise/bleed easily.  Psychiatric/Behavioral:  Negative for depression and suicidal ideas. The patient is not nervous/anxious and does not have insomnia.       Objective:    Physical Exam Constitutional:      General: She is not in acute distress.    Appearance: Normal appearance. She is not ill-appearing.  HENT:     Head: Normocephalic and atraumatic.     Right Ear: External ear normal.     Left Ear: External ear normal.  Eyes:     Extraocular Movements: Extraocular movements intact.     Pupils: Pupils are equal, round, and reactive to light.  Cardiovascular:     Rate and Rhythm: Normal rate and regular rhythm.     Pulses: Normal pulses.     Heart sounds: Normal heart sounds. No murmur heard.   No gallop.  Pulmonary:     Effort: Pulmonary effort is normal. No respiratory distress.     Breath sounds: Normal breath sounds. No wheezing, rhonchi or rales.  Abdominal:     General: Bowel sounds are normal. There is no distension.     Palpations: Abdomen is soft. There is no mass.     Tenderness: There is no abdominal tenderness. There is no guarding or rebound.     Hernia: No hernia is present.  Musculoskeletal:     Cervical back: Normal range of motion and neck supple.  Lymphadenopathy:     Cervical: No cervical adenopathy.  Skin:    General: Skin is warm and dry.  Neurological:     Mental Status: She is alert and oriented to person, place, and time.  Psychiatric:        Behavior: Behavior normal.    BP 96/78 (BP Location: Left Arm, Patient Position: Sitting, Cuff Size: Normal)    Pulse 79    Temp 98.5 F (36.9 C) (Oral)    Resp 18    Ht 5\' 5"  (1.651 m)    Wt 150 lb 3.2 oz (68.1 kg)    SpO2 96%    BMI 24.99 kg/m  Wt Readings from Last 3 Encounters:  08/08/21 150 lb 3.2 oz (68.1 kg)  02/24/21 155 lb (70.3 kg)  07/18/20 152 lb (68.9 kg)    Diabetic Foot Exam - Simple   No data  filed    Lab Results  Component Value Date   WBC 8.9 08/08/2021   HGB 12.4 08/08/2021   HCT 36.6 08/08/2021   PLT 253 08/08/2021   GLUCOSE 85 08/08/2021   CHOL 202 (H) 01/17/2020   TRIG 70.0 01/17/2020   HDL 45.30 01/17/2020   LDLCALC 142 (H) 01/17/2020   ALT 10 08/08/2021   AST 16 08/08/2021   NA 135 08/08/2021   K 3.3 (  L) 08/08/2021   CL 102 08/08/2021   CREATININE 0.89 08/08/2021   BUN 13 08/08/2021   CO2 24 08/08/2021   TSH 1.31 08/08/2021   HGBA1C 5.4 08/08/2021    Lab Results  Component Value Date   TSH 1.31 08/08/2021   Lab Results  Component Value Date   WBC 8.9 08/08/2021   HGB 12.4 08/08/2021   HCT 36.6 08/08/2021   MCV 93.8 08/08/2021   PLT 253 08/08/2021   Lab Results  Component Value Date   NA 135 08/08/2021   K 3.3 (L) 08/08/2021   CO2 24 08/08/2021   GLUCOSE 85 08/08/2021   BUN 13 08/08/2021   CREATININE 0.89 08/08/2021   BILITOT 0.6 08/08/2021   ALKPHOS 86 07/18/2020   AST 16 08/08/2021   ALT 10 08/08/2021   PROT 6.3 08/08/2021   ALBUMIN 4.6 07/18/2020   CALCIUM 8.9 08/08/2021   ANIONGAP 16 (H) 07/15/2017   GFR 89.72 07/18/2020   Lab Results  Component Value Date   CHOL 202 (H) 01/17/2020   Lab Results  Component Value Date   HDL 45.30 01/17/2020   Lab Results  Component Value Date   LDLCALC 142 (H) 01/17/2020   Lab Results  Component Value Date   TRIG 70.0 01/17/2020   Lab Results  Component Value Date   CHOLHDL 4 01/17/2020   Lab Results  Component Value Date   HGBA1C 5.4 08/08/2021       Assessment & Plan:   Problem List Items Addressed This Visit       Unprioritized   Polydipsia    Check labs       Relevant Orders   POCT glucose (manual entry) (Completed)   CBC with Differential/Platelet (Completed)   Comprehensive metabolic panel (Completed)   Hemoglobin A1c (Completed)   TSH (Completed)   Vitamin B12 (Completed)   Urinary tract infection with hematuria    Pt is on abx Today s ua is normal---  culture is pending       Relevant Orders   POCT Urinalysis Dipstick (Automated) (Completed)   Other Visit Diagnoses     Nausea    -  Primary   Relevant Orders   POCT urine pregnancy (Completed)   CBC with Differential/Platelet (Completed)   Comprehensive metabolic panel (Completed)   Hemoglobin A1c (Completed)   TSH (Completed)   Vitamin B12 (Completed)   Dizziness       Relevant Orders   CBC with Differential/Platelet (Completed)   Comprehensive metabolic panel (Completed)   Hemoglobin A1c (Completed)   TSH (Completed)   Vitamin B12 (Completed)       No orders of the defined types were placed in this encounter.   I,Vanessa Sampson,acting as a Education administrator for [employer], DO.,have documented all relevant documentation on the behalf of Vanessa Held, DO,as directed by  Vanessa Held, DO while in the presence of [place], DO. , personally preformed the services described in this documentation.  All medical record entries made by the scribe were at my direction and in my presence.  I have reviewed the chart and discharge instructions (if applicable) and agree that the record reflects my personal performance and is accurate and complete. 08/08/2021

## 2021-08-08 NOTE — Patient Instructions (Signed)
Urinary Tract Infection, Adult A urinary tract infection (UTI) is an infection of any part of the urinary tract. The urinary tract includes the kidneys, ureters, bladder, and urethra. These organs make, store, and get rid of urine in the body. An upper UTI affects the ureters and kidneys. A lower UTI affects the bladder and urethra. What are the causes? Most urinary tract infections are caused by bacteria in your genital area around your urethra, where urine leaves your body. These bacteria grow and cause inflammation of your urinary tract. What increases the risk? You are more likely to develop this condition if: You have a urinary catheter that stays in place. You are not able to control when you urinate or have a bowel movement (incontinence). You are female and you: Use a spermicide or diaphragm for birth control. Have low estrogen levels. Are pregnant. You have certain genes that increase your risk. You are sexually active. You take antibiotic medicines. You have a condition that causes your flow of urine to slow down, such as: An enlarged prostate, if you are female. Blockage in your urethra. A kidney stone. A nerve condition that affects your bladder control (neurogenic bladder). Not getting enough to drink, or not urinating often. You have certain medical conditions, such as: Diabetes. A weak disease-fighting system (immunesystem). Sickle cell disease. Gout. Spinal cord injury. What are the signs or symptoms? Symptoms of this condition include: Needing to urinate right away (urgency). Frequent urination. This may include small amounts of urine each time you urinate. Pain or burning with urination. Blood in the urine. Urine that smells bad or unusual. Trouble urinating. Cloudy urine. Vaginal discharge, if you are female. Pain in the abdomen or the lower back. You may also have: Vomiting or a decreased appetite. Confusion. Irritability or tiredness. A fever or  chills. Diarrhea. The first symptom in older adults may be confusion. In some cases, they may not have any symptoms until the infection has worsened. How is this diagnosed? This condition is diagnosed based on your medical history and a physical exam. You may also have other tests, including: Urine tests. Blood tests. Tests for STIs (sexually transmitted infections). If you have had more than one UTI, a cystoscopy or imaging studies may be done to determine the cause of the infections. How is this treated? Treatment for this condition includes: Antibiotic medicine. Over-the-counter medicines to treat discomfort. Drinking enough water to stay hydrated. If you have frequent infections or have other conditions such as a kidney stone, you may need to see a health care provider who specializes in the urinary tract (urologist). In rare cases, urinary tract infections can cause sepsis. Sepsis is a life-threatening condition that occurs when the body responds to an infection. Sepsis is treated in the hospital with IV antibiotics, fluids, and other medicines. Follow these instructions at home: Medicines Take over-the-counter and prescription medicines only as told by your health care provider. If you were prescribed an antibiotic medicine, take it as told by your health care provider. Do not stop using the antibiotic even if you start to feel better. General instructions Make sure you: Empty your bladder often and completely. Do not hold urine for long periods of time. Empty your bladder after sex. Wipe from front to back after urinating or having a bowel movement if you are female. Use each tissue only one time when you wipe. Drink enough fluid to keep your urine pale yellow. Keep all follow-up visits. This is important. Contact a health care provider   if: Your symptoms do not get better after 1-2 days. Your symptoms go away and then return. Get help right away if: You have severe pain in your  back or your lower abdomen. You have a fever or chills. You have nausea or vomiting. Summary A urinary tract infection (UTI) is an infection of any part of the urinary tract, which includes the kidneys, ureters, bladder, and urethra. Most urinary tract infections are caused by bacteria in your genital area. Treatment for this condition often includes antibiotic medicines. If you were prescribed an antibiotic medicine, take it as told by your health care provider. Do not stop using the antibiotic even if you start to feel better. Keep all follow-up visits. This is important. This information is not intended to replace advice given to you by your health care provider. Make sure you discuss any questions you have with your health care provider. Document Revised: 01/12/2020 Document Reviewed: 01/12/2020 Elsevier Patient Education  2022 [employer] Inc.  

## 2021-08-09 LAB — HEMOGLOBIN A1C
Hgb A1c MFr Bld: 5.4 % of total Hgb (ref <5.7)
Mean Plasma Glucose: 108 mg/dL
eAG (mmol/L): 6 mmol/L

## 2021-08-09 LAB — CBC WITH DIFFERENTIAL/PLATELET
Absolute Monocytes: 712 cells/uL (ref 200–950)
Basophils Absolute: 18 cells/uL (ref 0–200)
Basophils Relative: 0.2 %
Eosinophils Absolute: 62 cells/uL (ref 15–500)
Eosinophils Relative: 0.7 %
HCT: 36.6 % (ref 35.0–45.0)
Hemoglobin: 12.4 g/dL (ref 11.7–15.5)
Lymphs Abs: 2421 cells/uL (ref 850–3900)
MCH: 31.8 pg (ref 27.0–33.0)
MCHC: 33.9 g/dL (ref 32.0–36.0)
MCV: 93.8 fL (ref 80.0–100.0)
MPV: 12 fL (ref 7.5–12.5)
Monocytes Relative: 8 %
Neutro Abs: 5687 cells/uL (ref 1500–7800)
Neutrophils Relative %: 63.9 %
Platelets: 253 10*3/uL (ref 140–400)
RBC: 3.9 10*6/uL (ref 3.80–5.10)
RDW: 13.7 % (ref 11.0–15.0)
Total Lymphocyte: 27.2 %
WBC: 8.9 10*3/uL (ref 3.8–10.8)

## 2021-08-09 LAB — TSH: TSH: 1.31 mIU/L

## 2021-08-09 LAB — VITAMIN B12: Vitamin B-12: 1516 pg/mL — ABNORMAL HIGH (ref 200–1100)

## 2021-08-09 LAB — COMPREHENSIVE METABOLIC PANEL
AG Ratio: 1.7 (calc) (ref 1.0–2.5)
ALT: 10 U/L (ref 6–29)
AST: 16 U/L (ref 10–30)
Albumin: 4 g/dL (ref 3.6–5.1)
Alkaline phosphatase (APISO): 69 U/L (ref 31–125)
BUN: 13 mg/dL (ref 7–25)
CO2: 24 mmol/L (ref 20–32)
Calcium: 8.9 mg/dL (ref 8.6–10.2)
Chloride: 102 mmol/L (ref 98–110)
Creat: 0.89 mg/dL (ref 0.50–0.99)
Globulin: 2.3 g/dL (calc) (ref 1.9–3.7)
Glucose, Bld: 85 mg/dL (ref 65–99)
Potassium: 3.3 mmol/L — ABNORMAL LOW (ref 3.5–5.3)
Sodium: 135 mmol/L (ref 135–146)
Total Bilirubin: 0.6 mg/dL (ref 0.2–1.2)
Total Protein: 6.3 g/dL (ref 6.1–8.1)

## 2021-08-10 DIAGNOSIS — R631 Polydipsia: Secondary | ICD-10-CM | POA: Insufficient documentation

## 2021-08-10 DIAGNOSIS — N390 Urinary tract infection, site not specified: Secondary | ICD-10-CM | POA: Insufficient documentation

## 2021-08-10 DIAGNOSIS — R319 Hematuria, unspecified: Secondary | ICD-10-CM | POA: Insufficient documentation

## 2021-08-10 NOTE — Assessment & Plan Note (Signed)
Check labs 

## 2021-08-10 NOTE — Assessment & Plan Note (Signed)
Pt is on abx Today s ua is normal--- culture is pending

## 2021-09-02 ENCOUNTER — Other Ambulatory Visit: Payer: Self-pay | Admitting: Family Medicine

## 2021-09-02 ENCOUNTER — Other Ambulatory Visit (HOSPITAL_BASED_OUTPATIENT_CLINIC_OR_DEPARTMENT_OTHER): Payer: Self-pay

## 2021-09-02 DIAGNOSIS — F32A Depression, unspecified: Secondary | ICD-10-CM

## 2021-09-02 MED ORDER — LORAZEPAM 1 MG PO TABS
ORAL_TABLET | Freq: Two times a day (BID) | ORAL | 0 refills | Status: DC | PRN
  Filled 2021-09-02: qty 45, 22d supply, fill #0

## 2021-09-08 ENCOUNTER — Encounter: Payer: Self-pay | Admitting: Family Medicine

## 2021-09-08 ENCOUNTER — Other Ambulatory Visit (HOSPITAL_BASED_OUTPATIENT_CLINIC_OR_DEPARTMENT_OTHER): Payer: Self-pay

## 2021-09-08 MED ORDER — LINACLOTIDE 290 MCG PO CAPS
290.0000 ug | ORAL_CAPSULE | Freq: Every day | ORAL | 6 refills | Status: DC
  Filled 2021-09-08 – 2022-09-01 (×4): qty 30, 30d supply, fill #0

## 2021-09-09 ENCOUNTER — Other Ambulatory Visit (HOSPITAL_BASED_OUTPATIENT_CLINIC_OR_DEPARTMENT_OTHER): Payer: Self-pay

## 2021-09-10 ENCOUNTER — Other Ambulatory Visit (HOSPITAL_BASED_OUTPATIENT_CLINIC_OR_DEPARTMENT_OTHER): Payer: Self-pay

## 2021-09-11 ENCOUNTER — Encounter: Payer: Self-pay | Admitting: Family Medicine

## 2021-09-11 ENCOUNTER — Other Ambulatory Visit (HOSPITAL_BASED_OUTPATIENT_CLINIC_OR_DEPARTMENT_OTHER): Payer: Self-pay

## 2021-09-11 ENCOUNTER — Telehealth: Payer: Self-pay

## 2021-09-11 NOTE — Telephone Encounter (Signed)
PA denied. Product not covered by plan.  ? ?PA Case: [id number], Status: Closed, Closed Reason Code: FM Product not covered by this plan. Prior Authorization not available., Closed Rationale: [employer] Prior Authorization team is unable to review this product for a coverage determination as the requested medication is not on the patient's formulary. Please reach out to [facility] [employer] directly for this request. Thank you in advance.. Questions? Contact [phone] ?

## 2021-09-11 NOTE — Telephone Encounter (Signed)
PA initiated via Covermymeds; KEY:  [id number]. Awaiting determination.  ?

## 2021-09-12 ENCOUNTER — Other Ambulatory Visit (HOSPITAL_BASED_OUTPATIENT_CLINIC_OR_DEPARTMENT_OTHER): Payer: Self-pay

## 2021-09-15 ENCOUNTER — Ambulatory Visit (INDEPENDENT_AMBULATORY_CARE_PROVIDER_SITE_OTHER): Payer: 59 | Admitting: Psychology

## 2021-09-15 DIAGNOSIS — F3289 Other specified depressive episodes: Secondary | ICD-10-CM

## 2021-09-15 DIAGNOSIS — F418 Other specified anxiety disorders: Secondary | ICD-10-CM
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
## 2021-09-16 ENCOUNTER — Other Ambulatory Visit (HOSPITAL_BASED_OUTPATIENT_CLINIC_OR_DEPARTMENT_OTHER): Payer: Self-pay

## 2021-09-17 ENCOUNTER — Other Ambulatory Visit (HOSPITAL_BASED_OUTPATIENT_CLINIC_OR_DEPARTMENT_OTHER): Payer: Self-pay

## 2021-09-18 ENCOUNTER — Other Ambulatory Visit (HOSPITAL_BASED_OUTPATIENT_CLINIC_OR_DEPARTMENT_OTHER): Payer: Self-pay

## 2021-09-19 ENCOUNTER — Other Ambulatory Visit (HOSPITAL_BASED_OUTPATIENT_CLINIC_OR_DEPARTMENT_OTHER): Payer: Self-pay

## 2021-09-29 ENCOUNTER — Encounter: Payer: 59 | Admitting: Psychology

## 2021-09-29 DIAGNOSIS — F3289 Other specified depressive episodes: Secondary | ICD-10-CM

## 2021-09-29 NOTE — Progress Notes (Signed)
This encounter was created in error - please disregard.

## 2021-10-08 ENCOUNTER — Other Ambulatory Visit: Payer: Self-pay | Admitting: Family Medicine

## 2021-10-08 ENCOUNTER — Encounter: Payer: Self-pay | Admitting: Family Medicine

## 2021-10-08 ENCOUNTER — Other Ambulatory Visit (HOSPITAL_BASED_OUTPATIENT_CLINIC_OR_DEPARTMENT_OTHER): Payer: Self-pay

## 2021-10-08 DIAGNOSIS — F419 Anxiety disorder, unspecified: Secondary | ICD-10-CM

## 2021-10-08 MED ORDER — LORAZEPAM 1 MG PO TABS
1.0000 mg | ORAL_TABLET | Freq: Two times a day (BID) | ORAL | 0 refills | Status: DC | PRN
  Filled 2021-10-08: qty 45, 23d supply, fill #0

## 2021-10-13 ENCOUNTER — Encounter: Payer: 59 | Admitting: Psychology

## 2021-10-13 DIAGNOSIS — F3289 Other specified depressive episodes: Secondary | ICD-10-CM

## 2021-10-13 DIAGNOSIS — F418 Other specified anxiety disorders: Secondary | ICD-10-CM

## 2021-10-13 NOTE — Progress Notes (Deleted)
[facility] Counselor/Therapist Progress Note ? ?Patient ID: Vanessa Sampson, MRN: [id number]  ? ?Date: 10/13/21 ? ?Time Spent: ***  pm - *** pm : *** Minutes ? ?Treatment Type: Individual Therapy. ? ?Reported Symptoms: Depression and Anxiety ? ?Mental Status Exam: ?Appearance:  {PSY:22683}     ?Behavior: {PSY:21022743}  ?Motor: {PSY:22302}  ?Speech/Language:  {PSY:22685}  ?Affect: {PSY:22687}  ?Mood: {PSY:31886}  ?Thought process: {PSY:31888}  ?Thought content:   {PSY:832-162-6050}  ?Sensory/Perceptual disturbances:   {PSY:832-159-6209}  ?Orientation: {PSY:30297}  ?Attention: {PSY:22877}  ?Concentration: {PSY:725-073-2693}  ?Memory: {PSY:516-550-8341}  ?Fund of knowledge:  {PSY:725-073-2693}  ?Insight:   {PSY:725-073-2693}  ?Judgment:  {PSY:725-073-2693}  ?Impulse Control: {PSY:725-073-2693}  ? ?Risk Assessment: ?Danger to Self:  {PSY:22692} ?Self-injurious Behavior: {PSY:22692} ?Danger to Others: {PSY:22692} ?Duty to Warn:{PSY:311194} ?Physical Aggression / Violence:{PSY:21197} ?Access to Firearms a concern: {PSY:21197} ?Gang Involvement:{PSY:21197} ? ?Subjective:  ? ?Vanessa Sampson participated from {Patient Location:26691::"home"}, via video and consented to treatment. Therapist participated from office. We met online due to [place] pandemic. Vanessa Sampson reviewed the events of the past week.  ? ?***  ? ?We reviewed numerous treatment approaches including CBT, BA, Problem Solving, and Solution focused therapy. Psych-education regarding the Vanessa Sampson's diagnosis of Other depression ? ?Other specified anxiety disorders was provided during the session. We discussed Vanessa Sampson goals treatment goals which include ***. Vanessa Sampson provided verbal approval of the treatment plan.  ? ?Interventions: Psycho-education & Goal Setting.  ? ?Diagnosis:  ?Other depression ? ?Other specified anxiety disorders ? ? ?Treatment Plan: ? ?Client Abilities/Strengths ?Vanessa Sampson *** ? ?Support System: ?*** ? ?Client Treatment  Preferences ?*** ? ?Client Statement of Needs ?Vanessa Sampson would like to ***  ? ?Treatment Level ?Weekly ? ?Symptoms ? ?***   (Status: {Symptom Status:26744}) ?***   (Status: {Symptom Status:26744}) ? ?Goals:  ? ?Vanessa Sampson experiences symptoms of *** ? ? ?Target Date: 10/14/22 Frequency: Weekly  ?Progress: 0 Modality: individual  ? ? ?Therapist will provide referrals for additional resources as appropriate.  ?Therapist will provide psycho-education regarding Dayrin's diagnosis and corresponding treatment approaches and interventions. ?Licensed Clinical Social Worker, Buena Irish, LCSW will support the patient's ability to achieve the goals identified. will employ CBT, BA, Problem-solving, Solution Focused, Mindfulness,  coping skills, & other evidenced-based practices will be used to promote progress towards healthy functioning to help manage decrease symptoms associated with her diagnosis.  ? Reduce overall level, frequency, and intensity of the feelings of depression, anxiety and panic evidenced by decreased overall symptoms from 6 to 7 days/week to 0 to 1 days/week per client report for at least 3 consecutive months. ?Verbally express understanding of the relationship between feelings of depression, anxiety and their impact on thinking patterns and behaviors. ?Verbalize an understanding of the role that distorted thinking plays in creating fears, excessive worry, and ruminations. ? ? ? ?(Vanessa Sampson participated in the creation of the treatment plan) ? ? ? ?Buena Irish, LCSW ? ?  ?

## 2021-10-14 NOTE — Progress Notes (Signed)
This encounter was created in error - please disregard.

## 2021-11-03 ENCOUNTER — Other Ambulatory Visit (HOSPITAL_BASED_OUTPATIENT_CLINIC_OR_DEPARTMENT_OTHER): Payer: Self-pay

## 2021-11-03 ENCOUNTER — Other Ambulatory Visit: Payer: Self-pay | Admitting: Family Medicine

## 2021-11-03 DIAGNOSIS — F32A Depression, unspecified: Secondary | ICD-10-CM

## 2021-11-03 MED ORDER — LORAZEPAM 1 MG PO TABS
1.0000 mg | ORAL_TABLET | Freq: Two times a day (BID) | ORAL | 2 refills | Status: DC | PRN
  Filled 2021-11-03: qty 45, 23d supply, fill #0
  Filled 2021-12-04: qty 45, 23d supply, fill #1
  Filled 2022-01-07: qty 45, 23d supply, fill #2

## 2021-11-09 ENCOUNTER — Encounter (INDEPENDENT_AMBULATORY_CARE_PROVIDER_SITE_OTHER): Payer: 59 | Admitting: Family Medicine

## 2021-11-09 DIAGNOSIS — F419 Anxiety disorder, unspecified: Secondary | ICD-10-CM

## 2021-11-09 DIAGNOSIS — F32A Depression, unspecified: Secondary | ICD-10-CM | POA: Diagnosis not present

## 2021-11-11 ENCOUNTER — Other Ambulatory Visit (HOSPITAL_BASED_OUTPATIENT_CLINIC_OR_DEPARTMENT_OTHER): Payer: Self-pay

## 2021-11-11 MED ORDER — FLUOXETINE HCL 20 MG PO CAPS
20.0000 mg | ORAL_CAPSULE | Freq: Every day | ORAL | 3 refills | Status: DC
  Filled 2021-11-11: qty 60, 30d supply, fill #0
  Filled 2021-12-16: qty 60, 30d supply, fill #1
  Filled 2022-01-18: qty 60, 30d supply, fill #2
  Filled 2022-02-17: qty 60, 30d supply, fill #3

## 2021-11-11 NOTE — Addendum Note (Signed)
Addended by: Pearline Cables on: 11/11/2021 06:46 AM   Modules accepted: Orders

## 2021-11-11 NOTE — Telephone Encounter (Signed)

## 2021-12-04 ENCOUNTER — Other Ambulatory Visit (HOSPITAL_BASED_OUTPATIENT_CLINIC_OR_DEPARTMENT_OTHER): Payer: Self-pay

## 2021-12-08 ENCOUNTER — Encounter: Payer: Self-pay | Admitting: Family Medicine

## 2021-12-09 ENCOUNTER — Other Ambulatory Visit (HOSPITAL_BASED_OUTPATIENT_CLINIC_OR_DEPARTMENT_OTHER): Payer: Self-pay

## 2021-12-09 MED ORDER — TRULANCE 3 MG PO TABS
3.0000 mg | ORAL_TABLET | Freq: Every day | ORAL | 3 refills | Status: DC
  Filled 2021-12-09: qty 90, 90d supply, fill #0
  Filled 2022-03-03: qty 30, 30d supply, fill #1
  Filled 2022-04-03: qty 90, 90d supply, fill #1

## 2021-12-10 ENCOUNTER — Other Ambulatory Visit (HOSPITAL_BASED_OUTPATIENT_CLINIC_OR_DEPARTMENT_OTHER): Payer: Self-pay

## 2021-12-16 ENCOUNTER — Other Ambulatory Visit (HOSPITAL_BASED_OUTPATIENT_CLINIC_OR_DEPARTMENT_OTHER): Payer: Self-pay

## 2021-12-17 ENCOUNTER — Other Ambulatory Visit (HOSPITAL_BASED_OUTPATIENT_CLINIC_OR_DEPARTMENT_OTHER): Payer: Self-pay

## 2021-12-18 ENCOUNTER — Other Ambulatory Visit (HOSPITAL_BASED_OUTPATIENT_CLINIC_OR_DEPARTMENT_OTHER): Payer: Self-pay

## 2022-01-07 ENCOUNTER — Other Ambulatory Visit (HOSPITAL_BASED_OUTPATIENT_CLINIC_OR_DEPARTMENT_OTHER): Payer: Self-pay

## 2022-01-19 ENCOUNTER — Other Ambulatory Visit (HOSPITAL_BASED_OUTPATIENT_CLINIC_OR_DEPARTMENT_OTHER): Payer: Self-pay

## 2022-02-02 ENCOUNTER — Other Ambulatory Visit: Payer: Self-pay | Admitting: Family Medicine

## 2022-02-02 ENCOUNTER — Encounter: Payer: Self-pay | Admitting: Family Medicine

## 2022-02-02 ENCOUNTER — Other Ambulatory Visit (HOSPITAL_BASED_OUTPATIENT_CLINIC_OR_DEPARTMENT_OTHER): Payer: Self-pay

## 2022-02-02 DIAGNOSIS — F419 Anxiety disorder, unspecified: Secondary | ICD-10-CM

## 2022-02-02 MED ORDER — LORAZEPAM 1 MG PO TABS
1.0000 mg | ORAL_TABLET | Freq: Two times a day (BID) | ORAL | 0 refills | Status: DC | PRN
  Filled 2022-02-02 (×2): qty 45, 23d supply, fill #0

## 2022-02-17 ENCOUNTER — Other Ambulatory Visit (HOSPITAL_BASED_OUTPATIENT_CLINIC_OR_DEPARTMENT_OTHER): Payer: Self-pay

## 2022-03-03 ENCOUNTER — Other Ambulatory Visit: Payer: Self-pay | Admitting: Family Medicine

## 2022-03-03 DIAGNOSIS — F32A Depression, unspecified: Secondary | ICD-10-CM

## 2022-03-03 DIAGNOSIS — F419 Anxiety disorder, unspecified: Secondary | ICD-10-CM

## 2022-03-04 ENCOUNTER — Other Ambulatory Visit (HOSPITAL_BASED_OUTPATIENT_CLINIC_OR_DEPARTMENT_OTHER): Payer: Self-pay

## 2022-03-04 MED ORDER — LORAZEPAM 1 MG PO TABS
1.0000 mg | ORAL_TABLET | Freq: Two times a day (BID) | ORAL | 0 refills | Status: DC | PRN
  Filled 2022-03-04: qty 45, 23d supply, fill #0

## 2022-03-09 ENCOUNTER — Other Ambulatory Visit (HOSPITAL_BASED_OUTPATIENT_CLINIC_OR_DEPARTMENT_OTHER): Payer: Self-pay

## 2022-03-14 NOTE — Progress Notes (Signed)
[place] at [facility] [address], [place], [place] [address] [phone]  Date:  03/23/2022   Name:  Vanessa Sampson   DOB:  Aug 01, 1980   MRN:  [id number]  PCP:  Darreld Mclean, MD    Chief Complaint: No chief complaint on file.   History of Present Illness:  Vanessa Sampson is a 41 y.o. very pleasant female patient who presents with the following:  Patient is seen today for medication refill-virtual visit Most recent visit with myself was a virtual visit about 1 year ago Patient identity confirmed with 2 factors, she gives consent for virtual visit today.  The patient and myself are present on the visit today Pt is at home and I am at the office  She just got new health insurance  Health maintenance issues-we scheduled her for a physical exam next week Recommend COVID booster Flu shot Pap smear is up-to-date Mammogram may be due She had somewhat blood work done in February, can update today if she likes  She notes her anxiety has responded well to fluoxetine.  She did increase her dose to 40 mg, she is tolerating this well and wishes to continue Lorazepam 1 mg once to twice daily She is still interested in starting some therapy- encouraged her to do this  Patient Active Problem List   Diagnosis Date Noted   Urinary tract infection with hematuria 08/10/2021   Polydipsia 08/10/2021   Pre-diabetes 04/03/2019   Visit for preventive health examination 03/26/2015   Anxiety and depression 10/10/2014   Chronic idiopathic constipation 10/10/2014    Past Medical History:  Diagnosis Date   Anxiety     Past Surgical History:  Procedure Laterality Date   APPENDECTOMY  2011   WISDOM TOOTH EXTRACTION      Social History   Tobacco Use   Smoking status: Former   Smokeless tobacco: Never  Substance Use Topics   Alcohol use: Yes    Alcohol/week: 0.0 standard drinks of alcohol   Drug use: No    Family History   Problem Relation Age of Onset   Fibromyalgia Father    Hypertension Maternal Grandmother     Allergies  Allergen Reactions   Gluten Meal Swelling   Mosquito (Culex Pipiens) Allergy Skin Test Swelling    Eye.   Latex Rash    Medication list has been reviewed and updated.  Current Outpatient Medications on File Prior to Visit  Medication Sig Dispense Refill   FLUoxetine (PROZAC) 20 MG capsule Take 1 capsule (20 mg total) by mouth daily. Increase to 2 capsules (40 mg) after 2 weeks if desired 60 capsule 3   linaclotide (LINZESS) 290 MCG CAPS capsule Take 1 capsule (290 mcg total) by mouth daily before breakfast. 30 capsule 6   LORazepam (ATIVAN) 1 MG tablet Take 1 tablet (1 mg total) by mouth 2 (two) times daily as needed for anxiety. 45 tablet 0   LORazepam (ATIVAN) 1 MG tablet Take 1 tablet (1 mg total) by mouth 2 (two) times daily as needed for anxiety. 45 tablet 0   Multiple Vitamins-Minerals (MULTIVITAMIN ADULT PO) Take 1 tablet by mouth daily. Women's Health     Plecanatide (TRULANCE) 3 MG TABS Take 1 tablet (3 mg total) by mouth daily. 90 tablet 3   UNABLE TO FIND Med Name: Magnesium Supplement     UNABLE TO FIND Med Name: beet supplement for anemia     No current facility-administered medications  on file prior to visit.    Review of Systems:  As per HPI- otherwise negative.   Physical Examination: There were no vitals filed for this visit. There were no vitals filed for this visit. There is no height or weight on file to calculate BMI. Ideal Body Weight:    Patient observed her video monitor.  She looks well, her normal self.  No distress is noted   Assessment and Plan: Anxiety and depression - Plan: FLUoxetine (PROZAC) 40 MG capsule, LORazepam (ATIVAN) 1 MG tablet  Patient seen today virtually to discuss anxiety.  She is doing well with fluoxetine 40, will continue this and also refilled her lorazepam.  Scheduled her to see me next week for a physical exam  and  Signed Lamar Blinks, MD

## 2022-03-18 ENCOUNTER — Other Ambulatory Visit: Payer: Self-pay | Admitting: Family Medicine

## 2022-03-18 DIAGNOSIS — F419 Anxiety disorder, unspecified: Secondary | ICD-10-CM

## 2022-03-19 ENCOUNTER — Telehealth: Payer: Self-pay | Admitting: *Deleted

## 2022-03-19 ENCOUNTER — Other Ambulatory Visit (HOSPITAL_BASED_OUTPATIENT_CLINIC_OR_DEPARTMENT_OTHER): Payer: Self-pay

## 2022-03-19 MED ORDER — FLUOXETINE HCL 20 MG PO CAPS
20.0000 mg | ORAL_CAPSULE | Freq: Every day | ORAL | 0 refills | Status: DC
  Filled 2022-03-19: qty 30, 30d supply, fill #0

## 2022-03-19 NOTE — Telephone Encounter (Deleted)
CRITICAL VALUE STICKER  CRITICAL VALUE: Sodium -- 121.2  RECEIVER (on-site recipient of call): Kelle Darting, Faith NOTIFIED: 03/19/22 11:30am  MESSENGER (representative from lab): Hope  MD NOTIFIED: [place]  TIME OF NOTIFICATION: 11:32  RESPONSE:

## 2022-03-19 NOTE — Telephone Encounter (Signed)
Below documentation placed in error. Does not belong to this patient, please disregard.

## 2022-03-23 ENCOUNTER — Other Ambulatory Visit (HOSPITAL_BASED_OUTPATIENT_CLINIC_OR_DEPARTMENT_OTHER): Payer: Self-pay

## 2022-03-23 ENCOUNTER — Telehealth (INDEPENDENT_AMBULATORY_CARE_PROVIDER_SITE_OTHER): Payer: 59 | Admitting: Family Medicine

## 2022-03-23 DIAGNOSIS — F419 Anxiety disorder, unspecified: Secondary | ICD-10-CM

## 2022-03-23 DIAGNOSIS — F32A Depression, unspecified: Secondary | ICD-10-CM

## 2022-03-23 DIAGNOSIS — R69 Illness, unspecified: Secondary | ICD-10-CM | POA: Diagnosis not present

## 2022-03-23 MED ORDER — LORAZEPAM 1 MG PO TABS
1.0000 mg | ORAL_TABLET | Freq: Two times a day (BID) | ORAL | 0 refills | Status: DC | PRN
  Filled 2022-03-23 – 2022-04-06 (×2): qty 45, 23d supply, fill #0

## 2022-03-23 MED ORDER — FLUOXETINE HCL 40 MG PO CAPS
40.0000 mg | ORAL_CAPSULE | Freq: Every day | ORAL | 3 refills | Status: DC
  Filled 2022-03-23: qty 30, 30d supply, fill #0
  Filled 2022-04-19: qty 30, 30d supply, fill #1
  Filled 2022-05-18: qty 30, 30d supply, fill #2
  Filled 2022-06-09 – 2022-06-11 (×3): qty 30, 30d supply, fill #3
  Filled 2022-07-07: qty 30, 30d supply, fill #4
  Filled 2022-08-04: qty 30, 30d supply, fill #5
  Filled 2022-09-01: qty 30, 30d supply, fill #6
  Filled 2022-10-07: qty 30, 30d supply, fill #7
  Filled 2022-11-03: qty 30, 30d supply, fill #8
  Filled 2022-11-27: qty 30, 30d supply, fill #9
  Filled 2022-12-28: qty 30, 30d supply, fill #10
  Filled 2023-01-17 – 2023-01-24 (×2): qty 30, 30d supply, fill #11
  Filled ????-??-??: fill #3

## 2022-03-29 NOTE — Patient Instructions (Signed)
It was great to see you again today, I will be in touch and labs as soon as possible. Recommend the latest COVID-19 booster this fall

## 2022-03-29 NOTE — Progress Notes (Unsigned)
[facility] at [facility] [address] [place], [place] [address] [phone]  Date:  04/01/2022   Name:  Vanessa Sampson   DOB:  1981/05/23   MRN:  [id number]  PCP:  Vanessa Cables, MD    Chief Complaint: No chief complaint on file.   History of Present Illness:  Vanessa Sampson is a 41 y.o. very pleasant female patient who presents with the following:  Patient seen today for physical exam We had a recent virtual visit, decided to meet in person today and do labs  Recommend COVID-19 booster Flu shot Pap smear done last year, LSIL with negative HPV.  Recommendation was to repeat Pap and HPV in 3 years.  However, can go ahead and repeat Pap today if she would like Mammogram Labs done in February-CMP, B12, CBC, A1c 5.4%, thyroid  Patient Active Problem List   Diagnosis Date Noted   Urinary tract infection with hematuria 08/10/2021   Polydipsia 08/10/2021   Pre-diabetes 04/03/2019   Visit for preventive health examination 03/26/2015   Anxiety and depression 10/10/2014   Chronic idiopathic constipation 10/10/2014    Past Medical History:  Diagnosis Date   Anxiety     Past Surgical History:  Procedure Laterality Date   APPENDECTOMY  2011   WISDOM TOOTH EXTRACTION      Social History   Tobacco Use   Smoking status: Former   Smokeless tobacco: Never  Substance Use Topics   Alcohol use: Yes    Alcohol/week: 0.0 standard drinks of alcohol   Drug use: No    Family History  Problem Relation Age of Onset   Fibromyalgia Father    Hypertension Maternal Grandmother     Allergies  Allergen Reactions   Gluten Meal Swelling   Mosquito (Culex Pipiens) Allergy Skin Test Swelling    Eye.   Latex Rash    Medication list has been reviewed and updated.  Current Outpatient Medications on File Prior to Visit  Medication Sig Dispense Refill   FLUoxetine (PROZAC) 40 MG capsule Take 1 capsule (40 mg total) by mouth  daily. 90 capsule 3   linaclotide (LINZESS) 290 MCG CAPS capsule Take 1 capsule (290 mcg total) by mouth daily before breakfast. 30 capsule 6   LORazepam (ATIVAN) 1 MG tablet Take 1 tablet (1 mg total) by mouth 2 (two) times daily as needed for anxiety. 45 tablet 0   LORazepam (ATIVAN) 1 MG tablet Take 1 tablet (1 mg total) by mouth 2 (two) times daily as needed for anxiety. 45 tablet 0   Multiple Vitamins-Minerals (MULTIVITAMIN ADULT PO) Take 1 tablet by mouth daily. Women's Health     Plecanatide (TRULANCE) 3 MG TABS Take 1 tablet (3 mg total) by mouth daily. 90 tablet 3   UNABLE TO FIND Med Name: Magnesium Supplement     UNABLE TO FIND Med Name: beet supplement for anemia     No current facility-administered medications on file prior to visit.    Review of Systems:  As per HPI- otherwise negative.   Physical Examination: There were no vitals filed for this visit. There were no vitals filed for this visit. There is no height or weight on file to calculate BMI. Ideal Body Weight:    GEN: no acute distress. HEENT: Atraumatic, Normocephalic.  Ears and Nose: No external deformity. CV: RRR, No M/G/R. No JVD. No thrill. No extra heart sounds. PULM: CTA B, no wheezes, crackles, rhonchi. No retractions. No  resp. distress. No accessory muscle use. ABD: S, NT, ND, +BS. No rebound. No HSM. EXTR: No c/c/e PSYCH: Normally interactive. Conversant.    Assessment and Plan: *** Physical exam today.  Encouraged healthy diet and exercise routine Will plan further follow- up pending labs.  Signed Lamar Blinks, MD

## 2022-04-01 ENCOUNTER — Ambulatory Visit (INDEPENDENT_AMBULATORY_CARE_PROVIDER_SITE_OTHER): Payer: 59 | Admitting: Family Medicine

## 2022-04-01 ENCOUNTER — Encounter: Payer: Self-pay | Admitting: Family Medicine

## 2022-04-01 ENCOUNTER — Other Ambulatory Visit (HOSPITAL_COMMUNITY)
Admission: RE | Admit: 2022-04-01 | Discharge: 2022-04-01 | Disposition: A | Discharge status: Home / Self Care | Payer: 59 | Source: Ambulatory Visit | Attending: Family Medicine | Admitting: Family Medicine

## 2022-04-01 ENCOUNTER — Other Ambulatory Visit (HOSPITAL_BASED_OUTPATIENT_CLINIC_OR_DEPARTMENT_OTHER): Payer: Self-pay

## 2022-04-01 VITALS — BP 100/60 | HR 80 | Temp 98.0°F | Resp 18 | Ht 65.0 in | Wt 150.4 lb

## 2022-04-01 DIAGNOSIS — Z1329 Encounter for screening for other suspected endocrine disorder: Secondary | ICD-10-CM | POA: Diagnosis not present

## 2022-04-01 DIAGNOSIS — Z23 Encounter for immunization: Secondary | ICD-10-CM | POA: Diagnosis not present

## 2022-04-01 DIAGNOSIS — Z124 Encounter for screening for malignant neoplasm of cervix: Secondary | ICD-10-CM

## 2022-04-01 DIAGNOSIS — Z131 Encounter for screening for diabetes mellitus: Secondary | ICD-10-CM | POA: Diagnosis not present

## 2022-04-01 DIAGNOSIS — F419 Anxiety disorder, unspecified: Secondary | ICD-10-CM

## 2022-04-01 DIAGNOSIS — F32A Depression, unspecified: Secondary | ICD-10-CM

## 2022-04-01 DIAGNOSIS — Z0000 Encounter for general adult medical examination without abnormal findings: Secondary | ICD-10-CM | POA: Diagnosis not present

## 2022-04-01 DIAGNOSIS — Z1231 Encounter for screening mammogram for malignant neoplasm of breast: Secondary | ICD-10-CM | POA: Diagnosis not present

## 2022-04-01 DIAGNOSIS — Z13220 Encounter for screening for lipoid disorders: Secondary | ICD-10-CM

## 2022-04-01 DIAGNOSIS — Z130 Encounter for screening for diseases of the blood and blood-forming organs and certain disorders involving the immune mechanism: Secondary | ICD-10-CM

## 2022-04-01 LAB — CBC
HCT: 38.5 % (ref 36.0–46.0)
Hemoglobin: 12.8 g/dL (ref 12.0–15.0)
MCHC: 33.3 g/dL (ref 30.0–36.0)
MCV: 93.8 fl (ref 78.0–100.0)
Platelets: 273 10*3/uL (ref 150.0–400.0)
RBC: 4.11 Mil/uL (ref 3.87–5.11)
RDW: 13.3 % (ref 11.5–15.5)
WBC: 6.8 10*3/uL (ref 4.0–10.5)

## 2022-04-01 LAB — HEMOGLOBIN A1C: Hgb A1c MFr Bld: 5.8 % (ref 4.6–6.5)

## 2022-04-01 LAB — COMPREHENSIVE METABOLIC PANEL
ALT: 14 U/L (ref 0–35)
AST: 15 U/L (ref 0–37)
Albumin: 4.3 g/dL (ref 3.5–5.2)
Alkaline Phosphatase: 80 U/L (ref 39–117)
BUN: 14 mg/dL (ref 6–23)
CO2: 24 mEq/L (ref 19–32)
Calcium: 9.5 mg/dL (ref 8.4–10.5)
Chloride: 104 mEq/L (ref 96–112)
Creatinine, Ser: 0.75 mg/dL (ref 0.40–1.20)
GFR: 98.67 mL/min (ref >60.00)
Glucose, Bld: 84 mg/dL (ref 70–99)
Potassium: 4.7 mEq/L (ref 3.5–5.1)
Sodium: 134 mEq/L — ABNORMAL LOW (ref 135–145)
Total Bilirubin: 0.4 mg/dL (ref 0.2–1.2)
Total Protein: 7 g/dL (ref 6.0–8.3)

## 2022-04-01 LAB — LIPID PANEL
Cholesterol: 217 mg/dL — ABNORMAL HIGH (ref 0–200)
HDL: 51.2 mg/dL (ref >39.00)
LDL Cholesterol: 149 mg/dL — ABNORMAL HIGH (ref 0–99)
NonHDL: 166.12
Total CHOL/HDL Ratio: 4
Triglycerides: 85 mg/dL (ref 0.0–149.0)
VLDL: 17 mg/dL (ref 0.0–40.0)

## 2022-04-01 LAB — TSH: TSH: 1.51 u[IU]/mL (ref 0.35–5.50)

## 2022-04-01 MED ORDER — FLUOXETINE HCL 20 MG PO CAPS
20.0000 mg | ORAL_CAPSULE | Freq: Every day | ORAL | 2 refills | Status: DC
  Filled 2022-04-01 (×2): qty 30, 30d supply, fill #0
  Filled 2022-04-19: qty 30, 30d supply, fill #1
  Filled 2022-05-18: qty 30, 30d supply, fill #2

## 2022-04-03 LAB — CYTOLOGY - PAP
Comment: NEGATIVE
Diagnosis: UNDETERMINED — AB
High risk HPV: NEGATIVE

## 2022-04-04 ENCOUNTER — Encounter: Payer: Self-pay | Admitting: Family Medicine

## 2022-04-06 ENCOUNTER — Other Ambulatory Visit (HOSPITAL_BASED_OUTPATIENT_CLINIC_OR_DEPARTMENT_OTHER): Payer: Self-pay

## 2022-04-07 ENCOUNTER — Other Ambulatory Visit (HOSPITAL_BASED_OUTPATIENT_CLINIC_OR_DEPARTMENT_OTHER): Payer: Self-pay

## 2022-04-07 ENCOUNTER — Other Ambulatory Visit: Payer: Self-pay | Admitting: Family Medicine

## 2022-04-07 DIAGNOSIS — Z1231 Encounter for screening mammogram for malignant neoplasm of breast: Secondary | ICD-10-CM

## 2022-04-09 ENCOUNTER — Other Ambulatory Visit (HOSPITAL_BASED_OUTPATIENT_CLINIC_OR_DEPARTMENT_OTHER): Payer: Self-pay

## 2022-04-10 ENCOUNTER — Other Ambulatory Visit (HOSPITAL_BASED_OUTPATIENT_CLINIC_OR_DEPARTMENT_OTHER): Payer: Self-pay

## 2022-04-13 ENCOUNTER — Other Ambulatory Visit (HOSPITAL_BASED_OUTPATIENT_CLINIC_OR_DEPARTMENT_OTHER): Payer: Self-pay

## 2022-04-15 ENCOUNTER — Other Ambulatory Visit (HOSPITAL_BASED_OUTPATIENT_CLINIC_OR_DEPARTMENT_OTHER): Payer: Self-pay

## 2022-04-19 ENCOUNTER — Other Ambulatory Visit: Payer: Self-pay | Admitting: Family Medicine

## 2022-04-20 ENCOUNTER — Other Ambulatory Visit (HOSPITAL_BASED_OUTPATIENT_CLINIC_OR_DEPARTMENT_OTHER): Payer: Self-pay

## 2022-04-20 MED ORDER — LORAZEPAM 1 MG PO TABS
1.0000 mg | ORAL_TABLET | Freq: Two times a day (BID) | ORAL | 0 refills | Status: DC | PRN
  Filled 2022-04-20: qty 45, 23d supply, fill #0

## 2022-04-21 ENCOUNTER — Other Ambulatory Visit (HOSPITAL_BASED_OUTPATIENT_CLINIC_OR_DEPARTMENT_OTHER): Payer: Self-pay

## 2022-04-22 ENCOUNTER — Other Ambulatory Visit (HOSPITAL_BASED_OUTPATIENT_CLINIC_OR_DEPARTMENT_OTHER): Payer: Self-pay

## 2022-04-23 ENCOUNTER — Other Ambulatory Visit (HOSPITAL_BASED_OUTPATIENT_CLINIC_OR_DEPARTMENT_OTHER): Payer: Self-pay

## 2022-04-24 ENCOUNTER — Other Ambulatory Visit (HOSPITAL_BASED_OUTPATIENT_CLINIC_OR_DEPARTMENT_OTHER): Payer: Self-pay

## 2022-05-18 ENCOUNTER — Other Ambulatory Visit: Payer: Self-pay | Admitting: Family Medicine

## 2022-05-18 ENCOUNTER — Other Ambulatory Visit (HOSPITAL_BASED_OUTPATIENT_CLINIC_OR_DEPARTMENT_OTHER): Payer: Self-pay

## 2022-05-18 MED ORDER — LORAZEPAM 1 MG PO TABS
1.0000 mg | ORAL_TABLET | Freq: Two times a day (BID) | ORAL | 2 refills | Status: DC | PRN
  Filled 2022-05-18: qty 45, 23d supply, fill #0
  Filled 2022-06-09 – 2022-06-10 (×2): qty 45, 23d supply, fill #1
  Filled 2022-07-07: qty 45, 23d supply, fill #2

## 2022-06-09 ENCOUNTER — Other Ambulatory Visit: Payer: Self-pay | Admitting: Family Medicine

## 2022-06-09 DIAGNOSIS — F32A Depression, unspecified: Secondary | ICD-10-CM

## 2022-06-10 ENCOUNTER — Other Ambulatory Visit: Payer: Self-pay | Admitting: Family Medicine

## 2022-06-10 ENCOUNTER — Other Ambulatory Visit (HOSPITAL_BASED_OUTPATIENT_CLINIC_OR_DEPARTMENT_OTHER): Payer: Self-pay

## 2022-06-10 ENCOUNTER — Other Ambulatory Visit: Payer: Self-pay

## 2022-06-10 DIAGNOSIS — F419 Anxiety disorder, unspecified: Secondary | ICD-10-CM

## 2022-06-10 MED ORDER — FLUOXETINE HCL 20 MG PO CAPS
20.0000 mg | ORAL_CAPSULE | Freq: Every day | ORAL | 2 refills | Status: DC
  Filled 2022-06-10 – 2022-06-11 (×2): qty 30, 30d supply, fill #0
  Filled 2022-07-07: qty 30, 30d supply, fill #1
  Filled 2022-08-04: qty 30, 30d supply, fill #2
  Filled 2022-09-01: qty 30, 30d supply, fill #3
  Filled 2022-10-07: qty 30, 30d supply, fill #4
  Filled 2022-11-03: qty 30, 30d supply, fill #5
  Filled 2022-11-27: qty 30, 30d supply, fill #6
  Filled 2023-02-18: qty 30, 30d supply, fill #7

## 2022-06-11 ENCOUNTER — Other Ambulatory Visit: Payer: Self-pay

## 2022-06-11 ENCOUNTER — Other Ambulatory Visit (HOSPITAL_BASED_OUTPATIENT_CLINIC_OR_DEPARTMENT_OTHER): Payer: Self-pay

## 2022-07-08 ENCOUNTER — Other Ambulatory Visit: Payer: Self-pay

## 2022-07-15 ENCOUNTER — Other Ambulatory Visit (HOSPITAL_BASED_OUTPATIENT_CLINIC_OR_DEPARTMENT_OTHER): Payer: Self-pay

## 2022-07-26 IMAGING — US US ABDOMEN LIMITED
1 series · 14 of 25 positions shown · non-contrast
Comparison: None.

CLINICAL DATA: Bloating.

EXAM:
ULTRASOUND ABDOMEN LIMITED RIGHT UPPER QUADRANT

[Series 1: us abdomen limited · 14 of 43 slices shown]
[im 1/43]
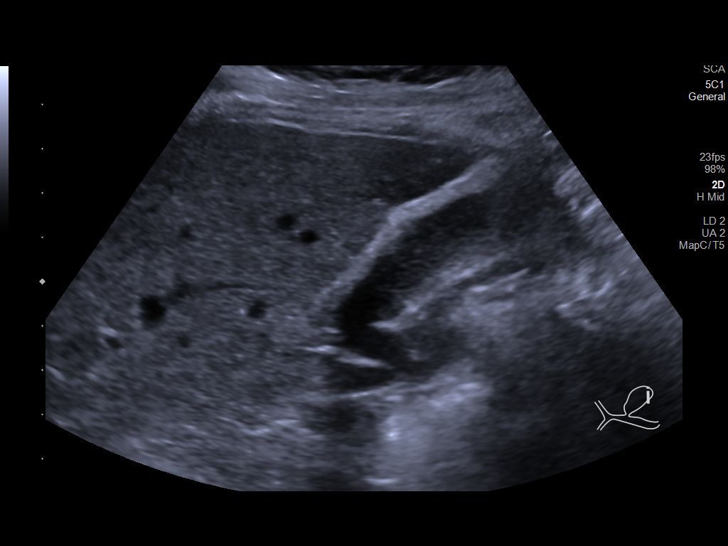
[im 4/43]
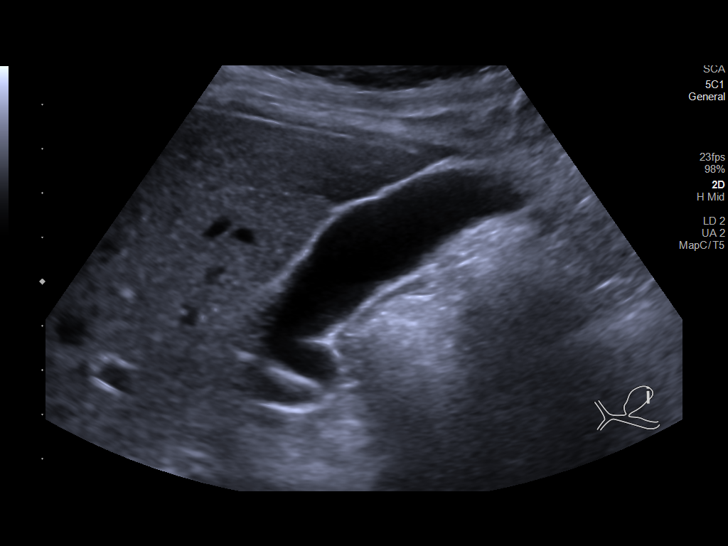
[im 8/43]
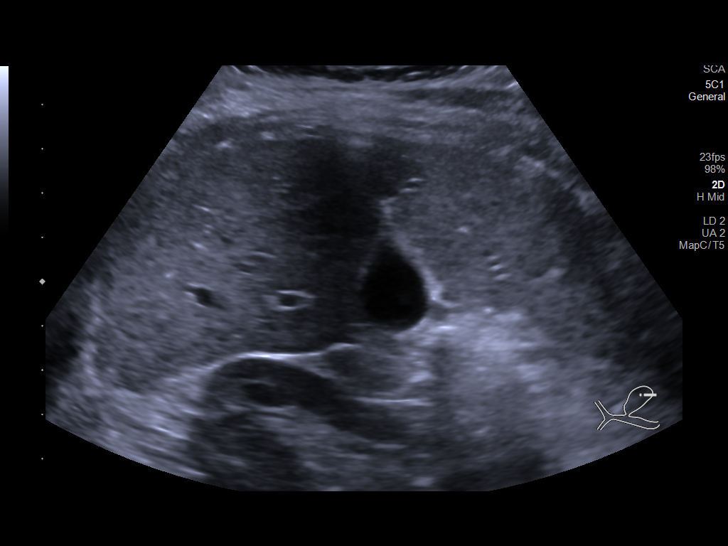
[im 11/43]
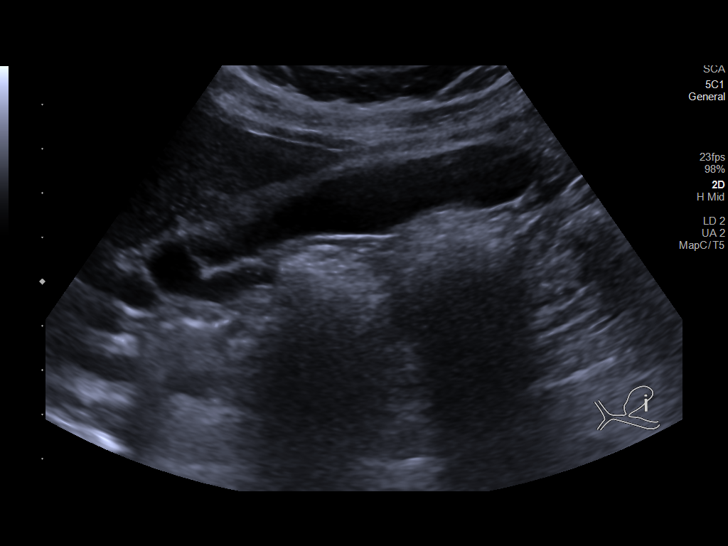
[im 15/43]
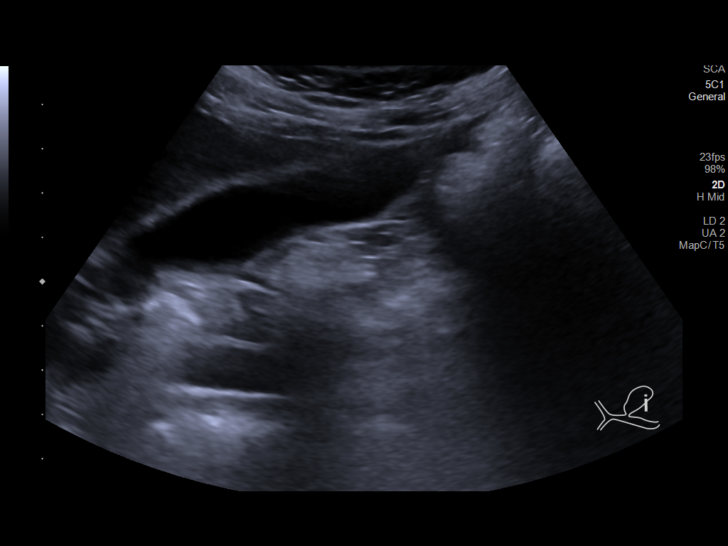
[im 16/43]
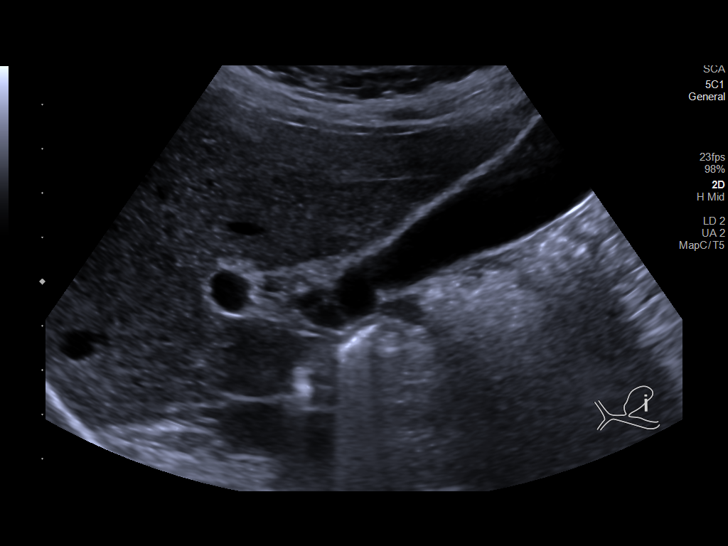
[im 20/43]
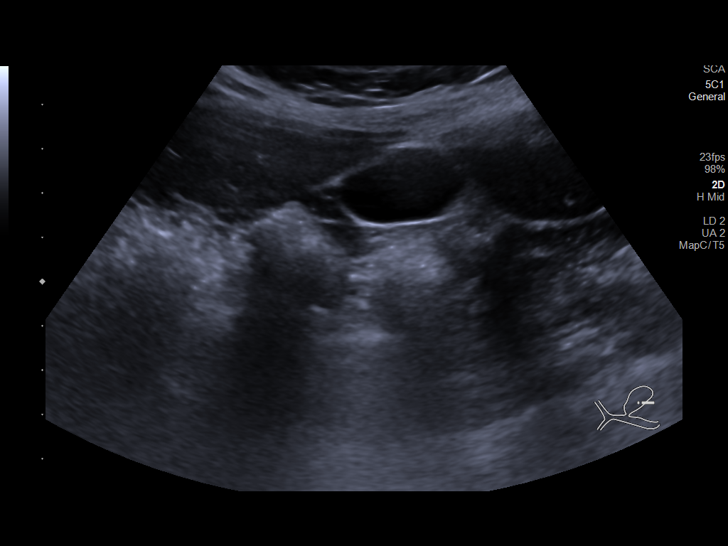
[im 23/43]
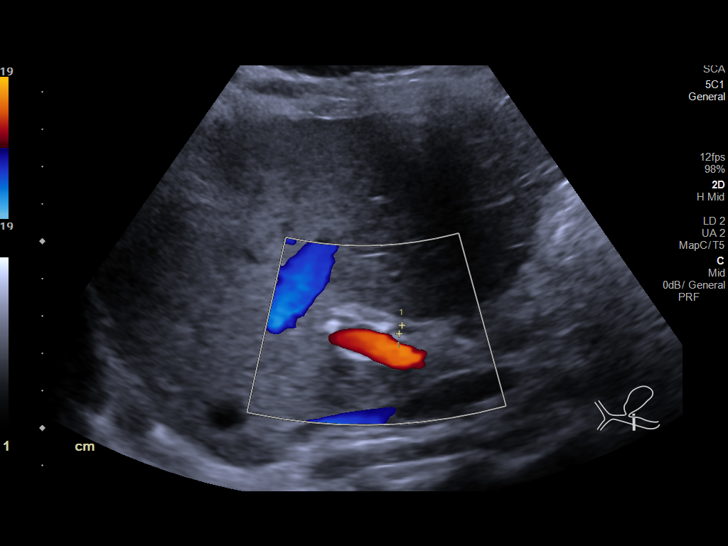
[im 27/43]
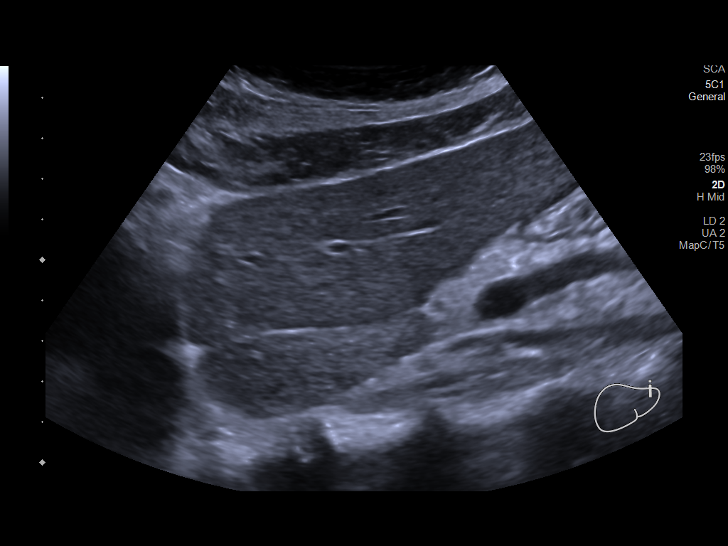
[im 29/43]
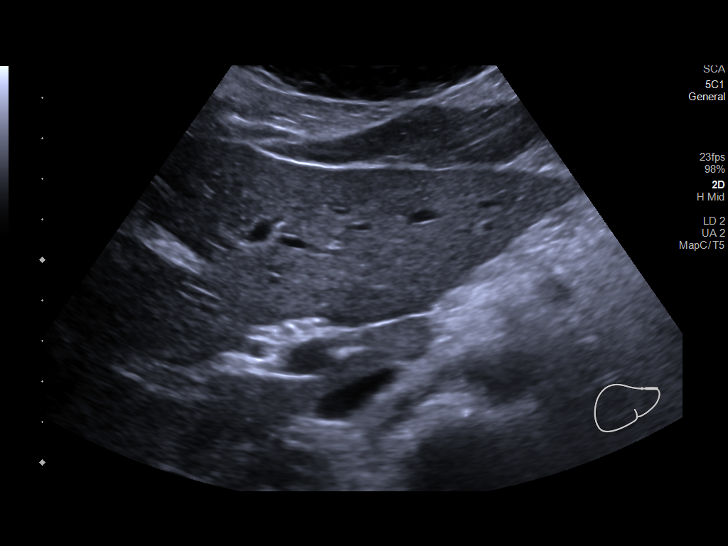
[im 32/43]
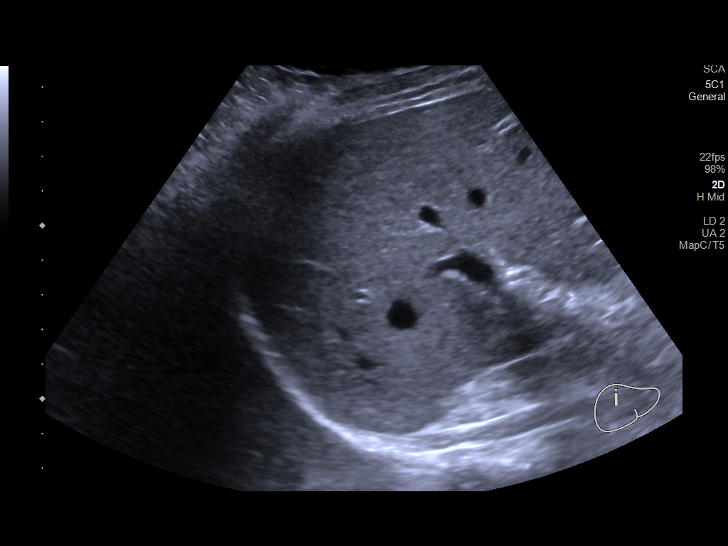
[im 36/43]
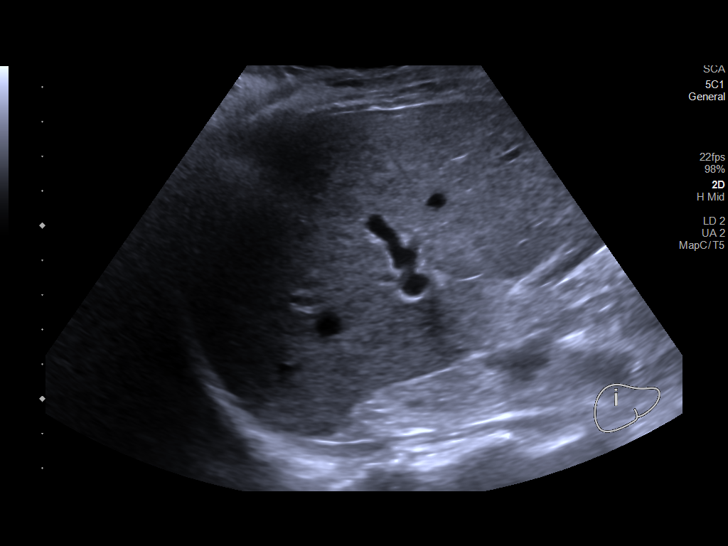
[im 39/43]
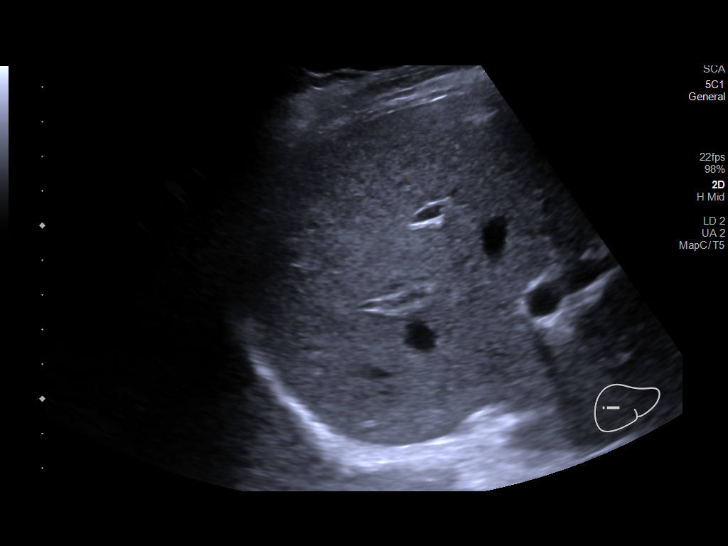
[im 43/43]
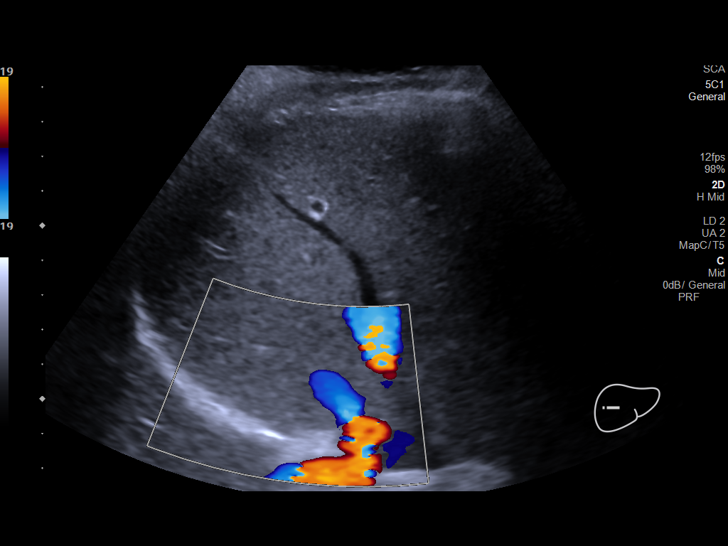

[14 of 25 positions shown; findings below may reference images not displayed]

FINDINGS: Gallbladder:

No gallstones or wall thickening visualized. No sonographic Murphy
sign noted by sonographer.

Common bile duct:

Diameter: 2 mm

Liver:

No focal lesion identified. Within normal limits in parenchymal
echogenicity. Portal vein is patent on color Doppler imaging with
normal direction of blood flow towards the liver.

Other: None.
IMPRESSION: Unremarkable right upper quadrant ultrasound.

## 2022-07-31 ENCOUNTER — Other Ambulatory Visit: Payer: Self-pay | Admitting: Family Medicine

## 2022-08-01 ENCOUNTER — Other Ambulatory Visit (HOSPITAL_COMMUNITY): Payer: Self-pay

## 2022-08-01 MED ORDER — LORAZEPAM 1 MG PO TABS
1.0000 mg | ORAL_TABLET | Freq: Two times a day (BID) | ORAL | 2 refills | Status: DC | PRN
  Filled 2022-08-01: qty 45, 23d supply, fill #0
  Filled 2022-08-19 – 2022-08-21 (×2): qty 45, 23d supply, fill #1

## 2022-08-05 ENCOUNTER — Other Ambulatory Visit (HOSPITAL_COMMUNITY): Payer: Self-pay

## 2022-08-05 ENCOUNTER — Other Ambulatory Visit (HOSPITAL_BASED_OUTPATIENT_CLINIC_OR_DEPARTMENT_OTHER): Payer: Self-pay

## 2022-08-20 ENCOUNTER — Other Ambulatory Visit: Payer: Self-pay

## 2022-08-22 ENCOUNTER — Other Ambulatory Visit (HOSPITAL_COMMUNITY): Payer: Self-pay

## 2022-08-24 ENCOUNTER — Other Ambulatory Visit: Payer: Self-pay

## 2022-08-31 ENCOUNTER — Other Ambulatory Visit (HOSPITAL_COMMUNITY): Payer: Self-pay

## 2022-09-01 ENCOUNTER — Encounter: Payer: Self-pay | Admitting: Family Medicine

## 2022-09-01 ENCOUNTER — Other Ambulatory Visit: Payer: Self-pay

## 2022-09-01 ENCOUNTER — Other Ambulatory Visit (HOSPITAL_BASED_OUTPATIENT_CLINIC_OR_DEPARTMENT_OTHER): Payer: Self-pay

## 2022-09-01 MED ORDER — LORAZEPAM 1 MG PO TABS
1.0000 mg | ORAL_TABLET | Freq: Two times a day (BID) | ORAL | 2 refills | Status: DC | PRN
  Filled 2022-09-01: qty 45, 23d supply, fill #0
  Filled 2022-10-07: qty 45, 23d supply, fill #1
  Filled 2022-11-04: qty 45, 23d supply, fill #2

## 2022-09-02 ENCOUNTER — Other Ambulatory Visit (HOSPITAL_BASED_OUTPATIENT_CLINIC_OR_DEPARTMENT_OTHER): Payer: Self-pay

## 2022-09-03 ENCOUNTER — Other Ambulatory Visit (HOSPITAL_BASED_OUTPATIENT_CLINIC_OR_DEPARTMENT_OTHER): Payer: Self-pay

## 2022-09-07 ENCOUNTER — Other Ambulatory Visit (HOSPITAL_BASED_OUTPATIENT_CLINIC_OR_DEPARTMENT_OTHER): Payer: Self-pay

## 2022-09-08 ENCOUNTER — Other Ambulatory Visit (HOSPITAL_BASED_OUTPATIENT_CLINIC_OR_DEPARTMENT_OTHER): Payer: Self-pay

## 2022-09-10 ENCOUNTER — Telehealth: Payer: Self-pay

## 2022-09-10 ENCOUNTER — Other Ambulatory Visit (HOSPITAL_BASED_OUTPATIENT_CLINIC_OR_DEPARTMENT_OTHER): Payer: Self-pay

## 2022-09-10 NOTE — Telephone Encounter (Signed)
PA initiated via Covermymeds; KEY:  [id number]. Awaiting determination.

## 2022-09-11 ENCOUNTER — Other Ambulatory Visit (HOSPITAL_BASED_OUTPATIENT_CLINIC_OR_DEPARTMENT_OTHER): Payer: Self-pay

## 2022-09-12 ENCOUNTER — Other Ambulatory Visit (HOSPITAL_BASED_OUTPATIENT_CLINIC_OR_DEPARTMENT_OTHER): Payer: Self-pay

## 2022-09-12 ENCOUNTER — Other Ambulatory Visit: Payer: Self-pay | Admitting: Family Medicine

## 2022-09-14 ENCOUNTER — Other Ambulatory Visit (HOSPITAL_BASED_OUTPATIENT_CLINIC_OR_DEPARTMENT_OTHER): Payer: Self-pay

## 2022-09-14 MED ORDER — LINACLOTIDE 290 MCG PO CAPS
290.0000 ug | ORAL_CAPSULE | Freq: Every day | ORAL | 6 refills | Status: DC
  Filled 2022-09-14 – 2022-09-30 (×2): qty 30, 30d supply, fill #0

## 2022-09-15 NOTE — Telephone Encounter (Signed)
PA approved.   Your PA request has been approved. Additional information will be provided in the approval communication. (Message 1145) Authorization Expiration Date: 09/11/2023

## 2022-09-21 ENCOUNTER — Other Ambulatory Visit (HOSPITAL_BASED_OUTPATIENT_CLINIC_OR_DEPARTMENT_OTHER): Payer: Self-pay

## 2022-09-30 ENCOUNTER — Other Ambulatory Visit: Payer: Self-pay | Admitting: Family Medicine

## 2022-09-30 ENCOUNTER — Other Ambulatory Visit (HOSPITAL_BASED_OUTPATIENT_CLINIC_OR_DEPARTMENT_OTHER): Payer: Self-pay

## 2022-10-05 ENCOUNTER — Other Ambulatory Visit (HOSPITAL_BASED_OUTPATIENT_CLINIC_OR_DEPARTMENT_OTHER): Payer: Self-pay

## 2022-10-05 MED ORDER — LINACLOTIDE 290 MCG PO CAPS
290.0000 ug | ORAL_CAPSULE | Freq: Every day | ORAL | 0 refills | Status: DC
  Filled 2022-10-05 – 2022-10-08 (×2): qty 30, 30d supply, fill #0

## 2022-10-07 ENCOUNTER — Other Ambulatory Visit: Payer: Self-pay

## 2022-10-08 ENCOUNTER — Other Ambulatory Visit (HOSPITAL_BASED_OUTPATIENT_CLINIC_OR_DEPARTMENT_OTHER): Payer: Self-pay

## 2022-11-04 ENCOUNTER — Other Ambulatory Visit: Payer: Self-pay

## 2022-11-27 ENCOUNTER — Other Ambulatory Visit (HOSPITAL_BASED_OUTPATIENT_CLINIC_OR_DEPARTMENT_OTHER): Payer: Self-pay

## 2022-11-27 ENCOUNTER — Other Ambulatory Visit: Payer: Self-pay | Admitting: Family Medicine

## 2022-11-27 ENCOUNTER — Encounter: Payer: Self-pay | Admitting: Family Medicine

## 2022-11-27 ENCOUNTER — Other Ambulatory Visit: Payer: Self-pay

## 2022-11-27 MED ORDER — LORAZEPAM 1 MG PO TABS
1.0000 mg | ORAL_TABLET | Freq: Two times a day (BID) | ORAL | 0 refills | Status: DC | PRN
  Filled 2022-11-27: qty 45, 23d supply, fill #0

## 2022-11-30 ENCOUNTER — Other Ambulatory Visit: Payer: Self-pay

## 2022-12-03 NOTE — Progress Notes (Signed)
[facility] [address], [address] [place], [place] [address] [phone]  Date:  12/09/2022   Name:  Vanessa Sampson   DOB:  December 05, 1980   MRN:  [id number]  PCP:  Pearline Cables, MD    Chief Complaint: Mental health follow up (Concerns/ questions: /Tdap due)   History of Present Illness:  Vanessa Sampson is a 42 y.o. very pleasant female patient who presents with the following:  Patient seen today with concern about her anxiety and depression/medication management Most recent visit with myself was in October for her physical At her last visit she was doing well generally with fluoxetine but wanted to try increasing her dose, we bumped her up to a total of 60 mg She is doing well on the 60 mg and would like to try coming down to 40 mg.  I advised her that is reasonable.  She will go ahead and make this change  She continues to use lorazepam as needed does not need a refill now but no further refills are available on file Her mammogram may also be due or may have been done at an outside facility- maybe [facility]? Called [facility]- they don't have an account for her   In addition to fluoxetine 60, she uses Linzess, Lorazepam  Can update her tetanus today Patient Active Problem List   Diagnosis Date Noted   Urinary tract infection with hematuria 08/10/2021   Polydipsia 08/10/2021   Pre-diabetes 04/03/2019   Visit for preventive health examination 03/26/2015   Anxiety and depression 10/10/2014   Chronic idiopathic constipation 10/10/2014    Past Medical History:  Diagnosis Date   Anxiety     Past Surgical History:  Procedure Laterality Date   APPENDECTOMY  2011   WISDOM TOOTH EXTRACTION      Social History   Tobacco Use   Smoking status: Former   Smokeless tobacco: Never  Substance Use Topics   Alcohol use: Yes    Alcohol/week: 0.0 standard drinks of alcohol   Drug use: No    Family History  Problem Relation Age of  Onset   Fibromyalgia Father    Hypertension Maternal Grandmother     Allergies  Allergen Reactions   Gluten Meal Swelling   Mosquito (Culex Pipiens) Allergy Skin Test Swelling    Eye.   Latex Rash    Medication list has been reviewed and updated.  Current Outpatient Medications on File Prior to Visit  Medication Sig Dispense Refill   FLUoxetine (PROZAC) 20 MG capsule Take 1 capsule (20 mg total) by mouth daily. Take with 40 mg for total of 60 mg 90 capsule 2   FLUoxetine (PROZAC) 40 MG capsule Take 1 capsule (40 mg total) by mouth daily. 90 capsule 3   LORazepam (ATIVAN) 1 MG tablet Take 1 tablet (1 mg total) by mouth 2 (two) times daily as needed for anxiety. 45 tablet 0   Multiple Vitamins-Minerals (MULTIVITAMIN ADULT PO) Take 1 tablet by mouth daily. Women's Health     No current facility-administered medications on file prior to visit.    Review of Systems:  As per HPI- otherwise negative.   Physical Examination: Vitals:   12/09/22 1104  BP: 118/64  Pulse: 78  Resp: 18  Temp: 97.6 F (36.4 C)  SpO2: 98%   Vitals:   12/09/22 1104  Weight: 148 lb (67.1 kg)  Height: 5\' 5"  (1.651 m)   Body mass index is 24.63 kg/m. Ideal  Body Weight: Weight in (lb) to have BMI = 25: 149.9  GEN: no acute distress.  Normal weight, looks well HEENT: Atraumatic, Normocephalic.  Ears and Nose: No external deformity. CV: RRR, No M/G/R. No JVD. No thrill. No extra heart sounds. PULM: CTA B, no wheezes, crackles, rhonchi. No retractions. No resp. distress. No accessory muscle use. ABD: S, NT, ND, +BS. No rebound. No HSM. EXTR: No c/c/e PSYCH: Normally interactive. Conversant.    Assessment and Plan: Anxiety and depression - Plan: LORazepam (ATIVAN) 1 MG tablet  Immunization due - Plan: Tdap vaccine greater than or equal to 7yo IM  Patient seen today for follow-up.  She is doing well with fluoxetine, would like to try dropping from 60 to 40 mg.  I advised her this is fine, she  will let me know if any problems  Refilled lorazepam to use as needed  Update tetanus  I called [facility], they did not have a record of patient.  Will send her a MyChart message and see if she would like me to order mammogram  Signed Abbe Amsterdam, MD

## 2022-12-03 NOTE — Patient Instructions (Addendum)
It was great to see you again today, please do not forget to come back for Pap smear and your physical in October!   You got your tetanus booster today, this is good for 10 years  Will get in touch with [facility] mammography and see if they have a mammogram on file for you  Certainly okay to cut your fluoxetine back to 40 mg daily.  If not going well you can always go back up to 60

## 2022-12-09 ENCOUNTER — Other Ambulatory Visit (HOSPITAL_BASED_OUTPATIENT_CLINIC_OR_DEPARTMENT_OTHER): Payer: Self-pay

## 2022-12-09 ENCOUNTER — Encounter: Payer: Self-pay | Admitting: Family Medicine

## 2022-12-09 ENCOUNTER — Ambulatory Visit: Payer: 59 | Admitting: Family Medicine

## 2022-12-09 VITALS — BP 118/64 | HR 78 | Temp 97.6°F | Resp 18 | Ht 65.0 in | Wt 148.0 lb

## 2022-12-09 DIAGNOSIS — Z23 Encounter for immunization: Secondary | ICD-10-CM

## 2022-12-09 DIAGNOSIS — Z1231 Encounter for screening mammogram for malignant neoplasm of breast: Secondary | ICD-10-CM

## 2022-12-09 DIAGNOSIS — F419 Anxiety disorder, unspecified: Secondary | ICD-10-CM | POA: Diagnosis not present

## 2022-12-09 DIAGNOSIS — F32A Depression, unspecified: Secondary | ICD-10-CM

## 2022-12-09 MED ORDER — LORAZEPAM 1 MG PO TABS
1.0000 mg | ORAL_TABLET | Freq: Two times a day (BID) | ORAL | 3 refills | Status: DC | PRN
  Filled 2022-12-09 – 2022-12-28 (×2): qty 45, 23d supply, fill #0
  Filled 2023-01-17: qty 45, 23d supply, fill #1
  Filled 2023-02-03 – 2023-02-04 (×3): qty 45, 23d supply, fill #2
  Filled 2023-02-18 – 2023-02-25 (×3): qty 45, 23d supply, fill #3

## 2022-12-28 ENCOUNTER — Other Ambulatory Visit: Payer: Self-pay

## 2022-12-28 ENCOUNTER — Other Ambulatory Visit (HOSPITAL_BASED_OUTPATIENT_CLINIC_OR_DEPARTMENT_OTHER): Payer: Self-pay

## 2023-01-18 ENCOUNTER — Other Ambulatory Visit (HOSPITAL_BASED_OUTPATIENT_CLINIC_OR_DEPARTMENT_OTHER): Payer: Self-pay

## 2023-01-18 ENCOUNTER — Other Ambulatory Visit: Payer: Self-pay

## 2023-02-03 ENCOUNTER — Other Ambulatory Visit (HOSPITAL_BASED_OUTPATIENT_CLINIC_OR_DEPARTMENT_OTHER): Payer: Self-pay

## 2023-02-04 ENCOUNTER — Other Ambulatory Visit (HOSPITAL_BASED_OUTPATIENT_CLINIC_OR_DEPARTMENT_OTHER): Payer: Self-pay

## 2023-02-04 ENCOUNTER — Other Ambulatory Visit: Payer: Self-pay

## 2023-02-05 ENCOUNTER — Other Ambulatory Visit (HOSPITAL_COMMUNITY): Payer: Self-pay

## 2023-02-18 ENCOUNTER — Other Ambulatory Visit (HOSPITAL_BASED_OUTPATIENT_CLINIC_OR_DEPARTMENT_OTHER): Payer: Self-pay

## 2023-02-18 ENCOUNTER — Other Ambulatory Visit: Payer: Self-pay | Admitting: Family Medicine

## 2023-02-18 DIAGNOSIS — F32A Depression, unspecified: Secondary | ICD-10-CM

## 2023-02-18 MED ORDER — FLUOXETINE HCL 20 MG PO CAPS
20.0000 mg | ORAL_CAPSULE | Freq: Every day | ORAL | 0 refills | Status: DC
Start: 2023-02-18 — End: 2023-03-29
  Filled 2023-02-18: qty 30, 30d supply, fill #0
  Filled 2023-03-24: qty 30, 30d supply, fill #1

## 2023-02-18 MED ORDER — FLUOXETINE HCL 40 MG PO CAPS
40.0000 mg | ORAL_CAPSULE | Freq: Every day | ORAL | 0 refills | Status: DC
Start: 2023-02-18 — End: 2023-05-24
  Filled 2023-02-18: qty 30, 30d supply, fill #0
  Filled 2023-03-24: qty 30, 30d supply, fill #1
  Filled 2023-04-19: qty 30, 30d supply, fill #2

## 2023-02-22 ENCOUNTER — Other Ambulatory Visit: Payer: Self-pay

## 2023-02-22 ENCOUNTER — Other Ambulatory Visit (HOSPITAL_BASED_OUTPATIENT_CLINIC_OR_DEPARTMENT_OTHER): Payer: Self-pay

## 2023-03-24 ENCOUNTER — Other Ambulatory Visit (HOSPITAL_BASED_OUTPATIENT_CLINIC_OR_DEPARTMENT_OTHER): Payer: Self-pay

## 2023-03-24 ENCOUNTER — Other Ambulatory Visit: Payer: Self-pay | Admitting: Family Medicine

## 2023-03-24 ENCOUNTER — Other Ambulatory Visit: Payer: Self-pay

## 2023-03-24 DIAGNOSIS — F32A Depression, unspecified: Secondary | ICD-10-CM

## 2023-03-24 MED ORDER — LORAZEPAM 1 MG PO TABS
1.0000 mg | ORAL_TABLET | Freq: Two times a day (BID) | ORAL | 3 refills | Status: DC | PRN
  Filled 2023-03-24: qty 45, 23d supply, fill #0
  Filled 2023-04-13: qty 45, 23d supply, fill #1
  Filled 2023-05-04: qty 45, 23d supply, fill #2
  Filled 2023-05-24 – 2023-05-25 (×2): qty 45, 23d supply, fill #3

## 2023-03-25 ENCOUNTER — Other Ambulatory Visit (HOSPITAL_BASED_OUTPATIENT_CLINIC_OR_DEPARTMENT_OTHER): Payer: Self-pay

## 2023-03-25 NOTE — Patient Instructions (Addendum)
It was great to see you again today, I will be in touch with your lab work and pap Flu shot today Recommend covid booster this fall I suspect you have mild arthritis in your thumb.  Ibuprofen, Tylenol, Voltaren gel may be helpful  You likely have a superficial nerve that is reacting to the hot water in the shower in your right leg.  I might try starting with the water a little bit cooler and seeing if your leg gets used to it.  Let me know if this continues to be a problem  Let's decrease prozac/ fluoxetine to 40 mg and add wellbutrin 150 daily

## 2023-03-25 NOTE — Progress Notes (Signed)
[facility] [address], [address] [place], [place] [address] [phone]  Date:  03/29/2023   Name:  Vanessa Sampson   DOB:  10/09/1980   MRN:  [id number]  PCP:  Vanessa Cables, MD    Chief Complaint: Annual Exam (Pt would like to discuss possibly starting Wellbutrin/Pt states she feels as she has a bruise on L thumb for 6-7 months /Pt in R leg when taking a shower (nerve pain) 6-7 months  )   History of Present Illness:  Vanessa Sampson is a 42 y.o. very pleasant female patient who presents with the following:  Patient seen today for annual follow-up/ CPE Most recent visit with myself was in June At that time she was using lorazepam as needed, she want to go down on her fluoxetine from 60 to 40 mg-she ended up going back up to 60 mg and notes this is working well for her anxiety.  However, she does have some persistent symptoms of depression.  She has time some friends of hers and wonders about adding Wellbutrin.  She has no seizure history She also uses Linzess for chronic constipation  Flu vaccine- give today  COVID booster-recommended Mammogram-May need update Pap smear-completed 8/23, ASCUS-1 year of needed today Update blood work today  She has noted left thumb pain for 6-7 months- can be tender to touch or with movement She is not aware of any injury  She also has noticed her right shin will burn- like pins and needles- only when she gets into the shower- no other time Once she has been in the shower for a minute or 2 the feeling goes away Patient Active Problem List   Diagnosis Date Noted   Urinary tract infection with hematuria 08/10/2021   Polydipsia 08/10/2021   Pre-diabetes 04/03/2019   Visit for preventive health examination 03/26/2015   Anxiety and depression 10/10/2014   Chronic idiopathic constipation 10/10/2014    Past Medical History:  Diagnosis Date   Anxiety     Past Surgical History:   Procedure Laterality Date   APPENDECTOMY  2011   WISDOM TOOTH EXTRACTION      Social History   Tobacco Use   Smoking status: Former   Smokeless tobacco: Never  Substance Use Topics   Alcohol use: Yes    Alcohol/week: 0.0 standard drinks of alcohol   Drug use: No    Family History  Problem Relation Age of Onset   Fibromyalgia Father    Hypertension Maternal Grandmother     Allergies  Allergen Reactions   Gluten Meal Swelling   Mosquito (Culex Pipiens) Allergy Skin Test Swelling    Eye.   Latex Rash    Medication list has been reviewed and updated.  Current Outpatient Medications on File Prior to Visit  Medication Sig Dispense Refill   FLUoxetine (PROZAC) 20 MG capsule Take 1 capsule (20 mg total) by mouth daily. Take with 40 mg for total of 60 mg 90 capsule 0   FLUoxetine (PROZAC) 40 MG capsule Take 1 capsule (40 mg total) by mouth daily. Take with 20mg  for a total of 60mg  daily. Due for appt 10/24. 90 capsule 0   LORazepam (ATIVAN) 1 MG tablet Take 1 tablet (1 mg total) by mouth 2 (two) times daily as needed for anxiety. 45 tablet 3   Multiple Vitamins-Minerals (MULTIVITAMIN ADULT PO) Take 1 tablet by mouth daily. Women's Health     No current facility-administered medications  on file prior to visit.    Review of Systems:  As per HPI- otherwise negative.   Physical Examination: Vitals:   03/29/23 1436  BP: 98/74  Pulse: 82  Resp: 18  SpO2: 98%   Vitals:   03/29/23 1436  Weight: 151 lb 9.6 oz (68.8 kg)  Height: 5\' 5"  (1.651 m)   Body mass index is 25.23 kg/m. Ideal Body Weight: Weight in (lb) to have BMI = 25: 149.9  GEN: no acute distress. Normal weight, looks well  HEENT: Atraumatic, Normocephalic.  Ears and Nose: No external deformity. CV: RRR, No M/G/R. No JVD. No thrill. No extra heart sounds. PULM: CTA B, no wheezes, crackles, rhonchi. No retractions. No resp. distress. No accessory muscle use. ABD: S, NT, ND, +BS. No rebound. No HSM. EXTR:  No c/c/e PSYCH: Normally interactive. Conversant.  Patient is left-handed.  Her left thumb MCP and IP joint shows some thickening, no stiffness.  Suspect she has some osteoarthritis Skin of both legs, dorsal pulses in both feet normal No calf swelling or tenderness Pap collected -normal vulva, vagina, cervix BP Readings from Last 3 Encounters:  03/29/23 98/74  12/09/22 118/64  04/01/22 100/60   EKG: NSR, qtc 395 Assessment and Plan: Physical exam  Anxiety and depression - Plan: EKG 12-Lead, buPROPion (WELLBUTRIN XL) 150 MG 24 hr tablet  Screening for thyroid disorder - Plan: TSH  Screening for deficiency anemia - Plan: CBC  Screening for hyperlipidemia - Plan: Lipid panel  Screening for cervical cancer - Plan: Cytology - PAP  Encounter for screening mammogram for malignant neoplasm of breast  Pre-diabetes - Plan: Comprehensive metabolic panel, Hemoglobin A1c  Fatigue, unspecified type - Plan: VITAMIN D 25 Hydroxy (Vit-D Deficiency, Fractures)  Patient seen today for physical exam.  Encouraged healthy diet and exercise routine Lab work is pending as above Reassured about likely osteoarthritis of her thumb.  We discussed age with her leg.  Suspect she has a superficial nerve that is reacting abnormally to hot water.  We will have her try starting with the water a bit cooler and see if this helps.  We can have her see neurology if she would like  EKG shows normal QTc-will add Wellbutrin to her fluoxetine.  We will have her reduce fluoxetine to 40 mg I asked her let me know how this works for her  Signed Abbe Amsterdam, MD

## 2023-03-29 ENCOUNTER — Ambulatory Visit (INDEPENDENT_AMBULATORY_CARE_PROVIDER_SITE_OTHER): Payer: 59 | Admitting: Family Medicine

## 2023-03-29 ENCOUNTER — Other Ambulatory Visit (HOSPITAL_COMMUNITY)
Admission: RE | Admit: 2023-03-29 | Discharge: 2023-03-29 | Disposition: A | Discharge status: Home / Self Care | Payer: 59 | Source: Ambulatory Visit | Attending: Family Medicine | Admitting: Family Medicine

## 2023-03-29 ENCOUNTER — Encounter: Payer: Self-pay | Admitting: Family Medicine

## 2023-03-29 ENCOUNTER — Other Ambulatory Visit (HOSPITAL_BASED_OUTPATIENT_CLINIC_OR_DEPARTMENT_OTHER): Payer: Self-pay

## 2023-03-29 VITALS — BP 98/74 | HR 82 | Resp 18 | Ht 65.0 in | Wt 151.6 lb

## 2023-03-29 DIAGNOSIS — Z124 Encounter for screening for malignant neoplasm of cervix: Secondary | ICD-10-CM

## 2023-03-29 DIAGNOSIS — Z0000 Encounter for general adult medical examination without abnormal findings: Secondary | ICD-10-CM | POA: Diagnosis not present

## 2023-03-29 DIAGNOSIS — F32A Depression, unspecified: Secondary | ICD-10-CM

## 2023-03-29 DIAGNOSIS — F419 Anxiety disorder, unspecified: Secondary | ICD-10-CM | POA: Diagnosis not present

## 2023-03-29 DIAGNOSIS — Z13220 Encounter for screening for lipoid disorders: Secondary | ICD-10-CM

## 2023-03-29 DIAGNOSIS — Z1231 Encounter for screening mammogram for malignant neoplasm of breast: Secondary | ICD-10-CM

## 2023-03-29 DIAGNOSIS — Z130 Encounter for screening for diseases of the blood and blood-forming organs and certain disorders involving the immune mechanism: Secondary | ICD-10-CM

## 2023-03-29 DIAGNOSIS — R5383 Other fatigue: Secondary | ICD-10-CM

## 2023-03-29 DIAGNOSIS — R7303 Prediabetes: Secondary | ICD-10-CM | POA: Diagnosis not present

## 2023-03-29 DIAGNOSIS — Z1329 Encounter for screening for other suspected endocrine disorder: Secondary | ICD-10-CM

## 2023-03-29 DIAGNOSIS — Z23 Encounter for immunization: Secondary | ICD-10-CM

## 2023-03-29 MED ORDER — BUPROPION HCL ER (XL) 150 MG PO TB24
150.0000 mg | ORAL_TABLET | Freq: Every day | ORAL | 1 refills | Status: DC
Start: 2023-03-29 — End: 2023-08-13
  Filled 2023-03-29: qty 30, 30d supply, fill #0
  Filled 2023-04-19 – 2023-04-22 (×2): qty 30, 30d supply, fill #1
  Filled 2023-05-24: qty 30, 30d supply, fill #2
  Filled 2023-06-22: qty 30, 30d supply, fill #3
  Filled 2023-07-08 – 2023-07-12 (×2): qty 30, 30d supply, fill #4
  Filled 2023-08-05: qty 30, 30d supply, fill #5

## 2023-03-30 ENCOUNTER — Encounter: Payer: Self-pay | Admitting: Family Medicine

## 2023-03-30 LAB — COMPREHENSIVE METABOLIC PANEL
ALT: 13 U/L (ref 0–35)
AST: 17 U/L (ref 0–37)
Albumin: 4.3 g/dL (ref 3.5–5.2)
Alkaline Phosphatase: 77 U/L (ref 39–117)
BUN: 13 mg/dL (ref 6–23)
CO2: 25 meq/L (ref 19–32)
Calcium: 9.5 mg/dL (ref 8.4–10.5)
Chloride: 103 meq/L (ref 96–112)
Creatinine, Ser: 0.85 mg/dL (ref 0.40–1.20)
GFR: 84.32 mL/min (ref >60.00)
Glucose, Bld: 68 mg/dL — ABNORMAL LOW (ref 70–99)
Potassium: 4.3 meq/L (ref 3.5–5.1)
Sodium: 136 meq/L (ref 135–145)
Total Bilirubin: 0.6 mg/dL (ref 0.2–1.2)
Total Protein: 7 g/dL (ref 6.0–8.3)

## 2023-03-30 LAB — CBC
HCT: 38.6 % (ref 36.0–46.0)
Hemoglobin: 12.9 g/dL (ref 12.0–15.0)
MCHC: 33.5 g/dL (ref 30.0–36.0)
MCV: 93 fL (ref 78.0–100.0)
Platelets: 243 10*3/uL (ref 150.0–400.0)
RBC: 4.15 Mil/uL (ref 3.87–5.11)
RDW: 14.2 % (ref 11.5–15.5)
WBC: 6.9 10*3/uL (ref 4.0–10.5)

## 2023-03-30 LAB — LIPID PANEL
Cholesterol: 248 mg/dL — ABNORMAL HIGH (ref 0–200)
HDL: 49.8 mg/dL (ref >39.00)
LDL Cholesterol: 180 mg/dL — ABNORMAL HIGH (ref 0–99)
NonHDL: 198.53
Total CHOL/HDL Ratio: 5
Triglycerides: 91 mg/dL (ref 0.0–149.0)
VLDL: 18.2 mg/dL (ref 0.0–40.0)

## 2023-03-30 LAB — VITAMIN D 25 HYDROXY (VIT D DEFICIENCY, FRACTURES): VITD: 29.48 ng/mL — ABNORMAL LOW (ref 30.00–100.00)

## 2023-03-30 LAB — HEMOGLOBIN A1C: Hgb A1c MFr Bld: 5.7 % (ref 4.6–6.5)

## 2023-03-30 LAB — TSH: TSH: 1.22 u[IU]/mL (ref 0.35–5.50)

## 2023-04-01 ENCOUNTER — Encounter: Payer: Self-pay | Admitting: Family Medicine

## 2023-04-01 LAB — CYTOLOGY - PAP
Comment: NEGATIVE
Diagnosis: NEGATIVE
Diagnosis: REACTIVE
High risk HPV: NEGATIVE

## 2023-04-13 ENCOUNTER — Other Ambulatory Visit: Payer: Self-pay

## 2023-04-14 NOTE — Telephone Encounter (Signed)
Patient had not read her MyChart message.  I called and left message on her cell phone recommending that she get a mammogram if not up-to-date

## 2023-04-19 ENCOUNTER — Other Ambulatory Visit (HOSPITAL_BASED_OUTPATIENT_CLINIC_OR_DEPARTMENT_OTHER): Payer: Self-pay

## 2023-04-20 ENCOUNTER — Other Ambulatory Visit (HOSPITAL_BASED_OUTPATIENT_CLINIC_OR_DEPARTMENT_OTHER): Payer: Self-pay

## 2023-04-20 ENCOUNTER — Encounter: Payer: Self-pay | Admitting: Family Medicine

## 2023-05-04 ENCOUNTER — Other Ambulatory Visit: Payer: Self-pay

## 2023-05-24 ENCOUNTER — Other Ambulatory Visit: Payer: Self-pay

## 2023-05-24 ENCOUNTER — Other Ambulatory Visit (HOSPITAL_BASED_OUTPATIENT_CLINIC_OR_DEPARTMENT_OTHER): Payer: Self-pay

## 2023-05-24 ENCOUNTER — Other Ambulatory Visit: Payer: Self-pay | Admitting: Family Medicine

## 2023-05-24 DIAGNOSIS — F419 Anxiety disorder, unspecified: Secondary | ICD-10-CM

## 2023-05-24 DIAGNOSIS — F32A Depression, unspecified: Secondary | ICD-10-CM

## 2023-05-24 MED ORDER — FLUOXETINE HCL 40 MG PO CAPS
40.0000 mg | ORAL_CAPSULE | Freq: Every day | ORAL | 0 refills | Status: DC
Start: 2023-05-24 — End: 2023-08-24
  Filled 2023-05-24: qty 90, 90d supply, fill #0

## 2023-06-22 ENCOUNTER — Other Ambulatory Visit: Payer: Self-pay | Admitting: Family Medicine

## 2023-06-22 ENCOUNTER — Other Ambulatory Visit: Payer: Self-pay

## 2023-06-22 ENCOUNTER — Other Ambulatory Visit (HOSPITAL_BASED_OUTPATIENT_CLINIC_OR_DEPARTMENT_OTHER): Payer: Self-pay

## 2023-06-22 DIAGNOSIS — F419 Anxiety disorder, unspecified: Secondary | ICD-10-CM

## 2023-06-22 MED ORDER — LORAZEPAM 1 MG PO TABS
1.0000 mg | ORAL_TABLET | Freq: Two times a day (BID) | ORAL | 3 refills | Status: DC | PRN
  Filled 2023-06-22: qty 45, 23d supply, fill #0
  Filled 2023-07-08 – 2023-07-14 (×2): qty 45, 23d supply, fill #1
  Filled 2023-08-05: qty 45, 23d supply, fill #2
  Filled 2023-08-24: qty 45, 23d supply, fill #3
  Filled ????-??-??: fill #1

## 2023-07-09 ENCOUNTER — Other Ambulatory Visit (HOSPITAL_BASED_OUTPATIENT_CLINIC_OR_DEPARTMENT_OTHER): Payer: Self-pay

## 2023-07-12 ENCOUNTER — Telehealth: Payer: Self-pay | Admitting: Neurology

## 2023-07-12 ENCOUNTER — Encounter: Payer: Self-pay | Admitting: Family Medicine

## 2023-07-12 ENCOUNTER — Other Ambulatory Visit (HOSPITAL_BASED_OUTPATIENT_CLINIC_OR_DEPARTMENT_OTHER): Payer: Self-pay

## 2023-07-12 DIAGNOSIS — F419 Anxiety disorder, unspecified: Secondary | ICD-10-CM

## 2023-07-12 DIAGNOSIS — F32A Depression, unspecified: Secondary | ICD-10-CM

## 2023-07-12 NOTE — Telephone Encounter (Signed)
Okay for refills? Looks like pt should be on Wellbutrin 300 mg?

## 2023-07-12 NOTE — Telephone Encounter (Signed)
Copied from CRM [id number]. Topic: General - Other >> Jul 12, 2023  3:07 PM Theodis Sato wrote: Reason for CRM: Patient requested to speak with pharmacy but states she has had a hard month and is out of the following medications and is inquiring to see if these can be filled early and states she will pay for them if her insurance company wont  buPROPion (WELLBUTRIN XL) 150 MG 24 hr tablet LORazepam (ATIVAN) 1 MG tablet

## 2023-07-12 NOTE — Telephone Encounter (Signed)
Message was sent to provider through the CRM.

## 2023-07-14 ENCOUNTER — Other Ambulatory Visit (HOSPITAL_BASED_OUTPATIENT_CLINIC_OR_DEPARTMENT_OTHER): Payer: Self-pay

## 2023-07-29 NOTE — Patient Instructions (Addendum)
Good to see you again today!   PLEASE call the breast center and set up your breast imaging.  I think you likely have some extra breast tissue in the right armpit but we need to check on this Let me know if you have any difficulty getting your mammo and ultrasound set up   Phone: [phone]

## 2023-07-29 NOTE — Progress Notes (Signed)
[facility] [address] [place], [place] [address] [phone]  Date:  08/02/2023   Name:  Vanessa Sampson   DOB:  Apr 06, 1981   MRN:  [id number]  PCP:  Pearline Cables, MD    Chief Complaint: No chief complaint on file.   History of Present Illness:  Vanessa Sampson is a 43 y.o. very pleasant female patient who presents with the following:  Pt seen today for recheck/ med monitor Last seen by myself in October- at that time we added wellbutrin to her fluoxetine for persistent depression symptoms  She notes a mass in her right axilla for about 18 years- it would also fill up when she was lactating years ago  Recommend covid booster Pap is UTD - normal 10/24 Mammo- this is overdue, will arrange for her  Labs done in October   Pt notes she went up to 300 wellbutrin about one week ago- seems to be tolerated well so far although she just started this increase   She did have an abnormal mammo in 2018; we have ordered follow-up imaging on several occasions 2020, 2022, and more recently   Patient Active Problem List   Diagnosis Date Noted   Urinary tract infection with hematuria 08/10/2021   Polydipsia 08/10/2021   Pre-diabetes 04/03/2019   Visit for preventive health examination 03/26/2015   Anxiety and depression 10/10/2014   Chronic idiopathic constipation 10/10/2014    Past Medical History:  Diagnosis Date   Anxiety     Past Surgical History:  Procedure Laterality Date   APPENDECTOMY  2011   WISDOM TOOTH EXTRACTION      Social History   Tobacco Use   Smoking status: Former   Smokeless tobacco: Never  Substance Use Topics   Alcohol use: Yes    Alcohol/week: 0.0 standard drinks of alcohol   Drug use: No    Family History  Problem Relation Age of Onset   Fibromyalgia Father    Hypertension Maternal Grandmother     Allergies  Allergen Reactions   Gluten Meal Swelling   Mosquito (Culex  Pipiens) Allergy Skin Test Swelling    Eye.   Latex Rash    Medication list has been reviewed and updated.  Current Outpatient Medications on File Prior to Visit  Medication Sig Dispense Refill   buPROPion (WELLBUTRIN XL) 150 MG 24 hr tablet Take 1 tablet (150 mg total) by mouth daily. (Patient taking differently: Take 300 mg by mouth daily.) 90 tablet 1   FLUoxetine (PROZAC) 40 MG capsule Take 1 capsule (40 mg total) by mouth daily. 90 capsule 0   LORazepam (ATIVAN) 1 MG tablet Take 1 tablet (1 mg total) by mouth 2 (two) times daily as needed for anxiety. 45 tablet 3   Multiple Vitamins-Minerals (MULTIVITAMIN ADULT PO) Take 1 tablet by mouth daily. Women's Health     No current facility-administered medications on file prior to visit.    Review of Systems:  As per HPI- otherwise negative.   Physical Examination: Vitals:   08/02/23 1301  BP: 94/64  Pulse: 91   Vitals:   08/02/23 1301  Weight: 156 lb 9.6 oz (71 kg)  Height: 5\' 5"  (1.651 m)   Body mass index is 26.06 kg/m. Ideal Body Weight: Weight in (lb) to have BMI = 25: 149.9  GEN: no acute distress.  Mild overweight, looks well  HEENT: Atraumatic, Normocephalic.  Ears and Nose: No external deformity. CV: RRR,  No M/G/R. No JVD. No thrill. No extra heart sounds. PULM: CTA B, no wheezes, crackles, rhonchi. No retractions. No resp. distress. No accessory muscle use. EXTR: No c/c/e PSYCH: Normally interactive. Conversant.  Right axilla: likely some auxiliary breast tissue in right axilla, no nodules or tenderness noted  Otherwise breast exam is normal bilaterally   BP Readings from Last 3 Encounters:  08/02/23 94/64  03/29/23 98/74  12/09/22 118/64    Assessment and Plan: Mass of right axilla - Plan: MM 3D DIAGNOSTIC MAMMOGRAM BILATERAL BREAST, US BREAST COMPLETE UNI RIGHT INC AXILLA  Following up today- she is overdue to follow-up on an abnormal mammo and she has some tissue in her right axilla- she does agree  to set this up Signed Abbe Amsterdam, MD

## 2023-08-02 ENCOUNTER — Encounter: Payer: Self-pay | Admitting: Family Medicine

## 2023-08-02 ENCOUNTER — Ambulatory Visit: Payer: 59 | Admitting: Family Medicine

## 2023-08-02 VITALS — BP 94/64 | HR 91 | Ht 65.0 in | Wt 156.6 lb

## 2023-08-02 DIAGNOSIS — R2231 Localized swelling, mass and lump, right upper limb: Secondary | ICD-10-CM | POA: Diagnosis not present

## 2023-08-05 ENCOUNTER — Other Ambulatory Visit: Payer: Self-pay

## 2023-08-13 ENCOUNTER — Other Ambulatory Visit: Payer: Self-pay | Admitting: Family Medicine

## 2023-08-13 DIAGNOSIS — F419 Anxiety disorder, unspecified: Secondary | ICD-10-CM

## 2023-08-16 ENCOUNTER — Other Ambulatory Visit (HOSPITAL_BASED_OUTPATIENT_CLINIC_OR_DEPARTMENT_OTHER): Payer: Self-pay

## 2023-08-16 MED ORDER — BUPROPION HCL ER (XL) 300 MG PO TB24
300.0000 mg | ORAL_TABLET | Freq: Every day | ORAL | 3 refills | Status: DC
Start: 2023-08-16 — End: 2024-03-08
  Filled 2023-08-16: qty 30, 30d supply, fill #0
  Filled 2023-09-15: qty 30, 30d supply, fill #1
  Filled 2023-10-13: qty 30, 30d supply, fill #2
  Filled 2023-11-12: qty 30, 30d supply, fill #3
  Filled 2023-12-10: qty 30, 30d supply, fill #4
  Filled 2023-12-26 – 2024-01-11 (×2): qty 30, 30d supply, fill #5
  Filled 2024-02-06: qty 30, 30d supply, fill #6
  Filled 2024-03-04: qty 30, 30d supply, fill #7

## 2023-08-16 NOTE — Telephone Encounter (Signed)
 Patient comment: The doctor asked me to take 300 mg of this medicine. And I see only 150 mg for this.

## 2023-08-19 ENCOUNTER — Other Ambulatory Visit: Payer: Self-pay | Admitting: Family Medicine

## 2023-08-19 ENCOUNTER — Other Ambulatory Visit (HOSPITAL_BASED_OUTPATIENT_CLINIC_OR_DEPARTMENT_OTHER): Payer: Self-pay

## 2023-08-19 MED ORDER — LINACLOTIDE 290 MCG PO CAPS
290.0000 ug | ORAL_CAPSULE | Freq: Every day | ORAL | 1 refills | Status: AC
Start: 2023-08-19 — End: ?
  Filled 2023-08-19: qty 30, 30d supply, fill #0
  Filled 2023-09-26 – 2023-12-28 (×3): qty 30, 30d supply, fill #1

## 2023-08-24 ENCOUNTER — Other Ambulatory Visit: Payer: Self-pay | Admitting: Family Medicine

## 2023-08-24 ENCOUNTER — Other Ambulatory Visit: Payer: Self-pay

## 2023-08-24 DIAGNOSIS — F32A Depression, unspecified: Secondary | ICD-10-CM

## 2023-08-25 ENCOUNTER — Other Ambulatory Visit (HOSPITAL_BASED_OUTPATIENT_CLINIC_OR_DEPARTMENT_OTHER): Payer: Self-pay

## 2023-08-25 ENCOUNTER — Other Ambulatory Visit: Payer: 59

## 2023-08-25 MED ORDER — FLUOXETINE HCL 40 MG PO CAPS
40.0000 mg | ORAL_CAPSULE | Freq: Every day | ORAL | 1 refills | Status: DC
  Filled 2023-08-25: qty 30, 30d supply, fill #0
  Filled 2023-09-19: qty 30, 30d supply, fill #1
  Filled 2023-10-13: qty 30, 30d supply, fill #2
  Filled 2023-11-12: qty 30, 30d supply, fill #3
  Filled 2023-12-10: qty 30, 30d supply, fill #4
  Filled 2023-12-26 – 2024-01-11 (×3): qty 30, 30d supply, fill #5

## 2023-09-08 ENCOUNTER — Other Ambulatory Visit: Payer: Self-pay | Admitting: Family Medicine

## 2023-09-08 ENCOUNTER — Ambulatory Visit
Admission: RE | Admit: 2023-09-08 | Discharge: 2023-09-08 | Disposition: A | Discharge status: Home / Self Care | Source: Ambulatory Visit | Attending: Family Medicine | Admitting: Family Medicine

## 2023-09-08 DIAGNOSIS — R2231 Localized swelling, mass and lump, right upper limb: Secondary | ICD-10-CM

## 2023-09-08 DIAGNOSIS — M79621 Pain in right upper arm: Secondary | ICD-10-CM | POA: Diagnosis not present

## 2023-09-08 DIAGNOSIS — R921 Mammographic calcification found on diagnostic imaging of breast: Secondary | ICD-10-CM | POA: Diagnosis not present

## 2023-09-15 ENCOUNTER — Other Ambulatory Visit: Payer: Self-pay | Admitting: Family Medicine

## 2023-09-15 ENCOUNTER — Other Ambulatory Visit: Payer: Self-pay

## 2023-09-15 ENCOUNTER — Other Ambulatory Visit (HOSPITAL_BASED_OUTPATIENT_CLINIC_OR_DEPARTMENT_OTHER): Payer: Self-pay

## 2023-09-15 DIAGNOSIS — F32A Depression, unspecified: Secondary | ICD-10-CM

## 2023-09-15 MED ORDER — LORAZEPAM 1 MG PO TABS
1.0000 mg | ORAL_TABLET | Freq: Two times a day (BID) | ORAL | 3 refills | Status: DC | PRN
  Filled 2023-09-15: qty 45, 23d supply, fill #0
  Filled 2023-10-05: qty 45, 23d supply, fill #1
  Filled 2023-10-25 – 2023-10-27 (×2): qty 45, 23d supply, fill #2
  Filled 2023-11-15 – 2023-11-16 (×2): qty 45, 23d supply, fill #3

## 2023-09-26 ENCOUNTER — Encounter: Payer: Self-pay | Admitting: Family Medicine

## 2023-09-27 ENCOUNTER — Telehealth: Payer: Self-pay

## 2023-09-27 ENCOUNTER — Other Ambulatory Visit (HOSPITAL_BASED_OUTPATIENT_CLINIC_OR_DEPARTMENT_OTHER): Payer: Self-pay

## 2023-09-27 ENCOUNTER — Other Ambulatory Visit (HOSPITAL_COMMUNITY): Payer: Self-pay

## 2023-09-27 NOTE — Telephone Encounter (Signed)
 Pharmacy Patient Advocate Encounter  Received notification from [facility] that Prior Authorization for Linzess capsuleshas been APPROVED from 09/27/2023 to 09/26/2024. Unable to obtain price due to refill too soon rejection, last fill date 09/27/2023 next available fill date05/082025   PA #/Case ID/Reference #: [id number]

## 2023-09-27 NOTE — Telephone Encounter (Signed)
 ERROR

## 2023-09-27 NOTE — Telephone Encounter (Signed)
 Pharmacy Patient Advocate Encounter   Received notification from Patient Pharmacy that prior authorization for Linzess 290MCG capsules is required/requested.   Insurance verification completed.   The patient is insured through [facility] .   Per test claim: PA required; PA submitted to above mentioned insurance via CoverMyMeds Key/confirmation #/EOC [id number] Status is pending

## 2023-10-01 NOTE — Progress Notes (Deleted)
 [facility] [address] [place], [place] [address] [phone]  Date:  10/04/2023   Name:  Vanessa Sampson   DOB:  [birth year]   MRN:  [id number]  PCP:  Vanessa Partridge, MD    Chief Complaint: No chief complaint on file.   History of Present Illness:  Vanessa Sampson is a 43 y.o. very pleasant female patient who presents with the following:  Patient seen today to discuss medications.  Most recent visit with myself was in February for concern of an axillary mass.  I ordered a diagnostic mammogram and ultrasound-reassuring  At her last visit she had also recently increased her Wellbutrin  to 300 mg in addition to fluoxetine  40 and lorazepam  1 mg twice daily as needed  Patient contacted me recently with concern of possible anxiety attacks: for the last couple of months I have been feeling dizzy and about to faint and sweaty when I'm around a lot of people I never had this before and just thought to let you know. Maybe see what I can do.   Patient Active Problem List   Diagnosis Date Noted   Urinary tract infection with hematuria 08/10/2021   Polydipsia 08/10/2021   Pre-diabetes 04/03/2019   Visit for preventive health examination 03/26/2015   Anxiety and depression 10/10/2014   Chronic idiopathic constipation 10/10/2014    Past Medical History:  Diagnosis Date   Anxiety     Past Surgical History:  Procedure Laterality Date   APPENDECTOMY  2011   WISDOM TOOTH EXTRACTION      Social History   Tobacco Use   Smoking status: Former   Smokeless tobacco: Never  Substance Use Topics   Alcohol use: Yes    Alcohol/week: 0.0 standard drinks of alcohol   Drug use: No    Family History  Problem Relation Age of Onset   Fibromyalgia Father    Hypertension Maternal Grandmother     Allergies  Allergen Reactions   Gluten Meal Swelling   Mosquito (Culex Pipiens) Allergy Skin Test Swelling    Eye.   Latex Rash     Medication list has been reviewed and updated.  Current Outpatient Medications on File Prior to Visit  Medication Sig Dispense Refill   buPROPion  (WELLBUTRIN  XL) 300 MG 24 hr tablet Take 1 tablet (300 mg total) by mouth daily. 90 tablet 3   FLUoxetine  (PROZAC ) 40 MG capsule Take 1 capsule (40 mg total) by mouth daily. 90 capsule 1   linaclotide  (LINZESS ) 290 MCG CAPS capsule Take 1 capsule (290 mcg total) by mouth daily before breakfast. 90 capsule 1   LORazepam  (ATIVAN ) 1 MG tablet Take 1 tablet (1 mg total) by mouth 2 (two) times daily as needed for anxiety. 45 tablet 3   Multiple Vitamins-Minerals (MULTIVITAMIN ADULT PO) Take 1 tablet by mouth daily. Women's Health     No current facility-administered medications on file prior to visit.    Review of Systems:  As per HPI- otherwise negative.   Physical Examination: There were no vitals filed for this visit. There were no vitals filed for this visit. There is no height or weight on file to calculate BMI. Ideal Body Weight:    GEN: no acute distress. HEENT: Atraumatic, Normocephalic.  Ears and Nose: No external deformity. CV: RRR, No M/G/R. No JVD. No thrill. No extra heart sounds. PULM: CTA B, no wheezes, crackles, rhonchi. No retractions. No resp. distress. No accessory muscle use.  ABD: S, NT, ND, +BS. No rebound. No HSM. EXTR: No c/c/e PSYCH: Normally interactive. Conversant.    Assessment and Plan: ***  Signed Gates Kasal, MD

## 2023-10-04 ENCOUNTER — Ambulatory Visit: Admitting: Family Medicine

## 2023-10-06 ENCOUNTER — Other Ambulatory Visit: Payer: Self-pay

## 2023-10-06 ENCOUNTER — Other Ambulatory Visit (HOSPITAL_BASED_OUTPATIENT_CLINIC_OR_DEPARTMENT_OTHER): Payer: Self-pay

## 2023-10-25 ENCOUNTER — Other Ambulatory Visit (HOSPITAL_BASED_OUTPATIENT_CLINIC_OR_DEPARTMENT_OTHER): Payer: Self-pay

## 2023-10-25 ENCOUNTER — Other Ambulatory Visit: Payer: Self-pay

## 2023-11-04 ENCOUNTER — Encounter: Payer: Self-pay | Admitting: Family Medicine

## 2023-11-15 ENCOUNTER — Other Ambulatory Visit: Payer: Self-pay

## 2023-11-16 ENCOUNTER — Other Ambulatory Visit (HOSPITAL_BASED_OUTPATIENT_CLINIC_OR_DEPARTMENT_OTHER): Payer: Self-pay

## 2023-11-27 ENCOUNTER — Emergency Department (HOSPITAL_COMMUNITY)

## 2023-11-27 ENCOUNTER — Ambulatory Visit
Admission: EM | Admit: 2023-11-27 | Discharge: 2023-11-27 | Disposition: A | Discharge status: Home / Self Care | Attending: Physician Assistant | Admitting: Physician Assistant

## 2023-11-27 ENCOUNTER — Emergency Department (HOSPITAL_COMMUNITY)
Admission: EM | Admit: 2023-11-27 | Discharge: 2023-11-27 | Disposition: A | Discharge status: Home / Self Care | Attending: Emergency Medicine | Admitting: Emergency Medicine

## 2023-11-27 ENCOUNTER — Other Ambulatory Visit: Payer: Self-pay

## 2023-11-27 DIAGNOSIS — R079 Chest pain, unspecified: Secondary | ICD-10-CM

## 2023-11-27 DIAGNOSIS — Z91040 Latex allergy status: Secondary | ICD-10-CM | POA: Insufficient documentation

## 2023-11-27 DIAGNOSIS — R112 Nausea with vomiting, unspecified: Secondary | ICD-10-CM | POA: Insufficient documentation

## 2023-11-27 DIAGNOSIS — E871 Hypo-osmolality and hyponatremia: Secondary | ICD-10-CM | POA: Insufficient documentation

## 2023-11-27 DIAGNOSIS — R0789 Other chest pain: Secondary | ICD-10-CM | POA: Diagnosis not present

## 2023-11-27 DIAGNOSIS — R1084 Generalized abdominal pain: Secondary | ICD-10-CM

## 2023-11-27 DIAGNOSIS — R109 Unspecified abdominal pain: Secondary | ICD-10-CM | POA: Diagnosis not present

## 2023-11-27 DIAGNOSIS — R1011 Right upper quadrant pain: Secondary | ICD-10-CM | POA: Diagnosis not present

## 2023-11-27 LAB — I-STAT CHEM 8, ED
BUN: 17 mg/dL (ref 6–20)
Calcium, Ion: 1.12 mmol/L — ABNORMAL LOW (ref 1.15–1.40)
Chloride: 104 mmol/L (ref 98–111)
Creatinine, Ser: 1 mg/dL (ref 0.44–1.00)
Glucose, Bld: 102 mg/dL — ABNORMAL HIGH (ref 70–99)
HCT: 42 % (ref 36.0–46.0)
Hemoglobin: 14.3 g/dL (ref 12.0–15.0)
Potassium: 4.6 mmol/L (ref 3.5–5.1)
Sodium: 135 mmol/L (ref 135–145)
TCO2: 18 mmol/L — ABNORMAL LOW (ref 22–32)

## 2023-11-27 LAB — COMPREHENSIVE METABOLIC PANEL WITH GFR
ALT: 25 U/L (ref 0–44)
AST: 36 U/L (ref 15–41)
Albumin: 4.6 g/dL (ref 3.5–5.0)
Alkaline Phosphatase: 70 U/L (ref 38–126)
Anion gap: 15 (ref 5–15)
BUN: 15 mg/dL (ref 6–20)
CO2: 16 mmol/L — ABNORMAL LOW (ref 22–32)
Calcium: 9.4 mg/dL (ref 8.9–10.3)
Chloride: 101 mmol/L (ref 98–111)
Creatinine, Ser: 0.89 mg/dL (ref 0.44–1.00)
GFR, Estimated: >60 mL/min (ref >60)
Glucose, Bld: 103 mg/dL — ABNORMAL HIGH (ref 70–99)
Potassium: 4.1 mmol/L (ref 3.5–5.1)
Sodium: 132 mmol/L — ABNORMAL LOW (ref 135–145)
Total Bilirubin: 1.4 mg/dL — ABNORMAL HIGH (ref 0.0–1.2)
Total Protein: 8.2 g/dL — ABNORMAL HIGH (ref 6.5–8.1)

## 2023-11-27 LAB — CBC WITH DIFFERENTIAL/PLATELET
Abs Immature Granulocytes: 0.04 10*3/uL (ref 0.00–0.07)
Basophils Absolute: 0 10*3/uL (ref 0.0–0.1)
Basophils Relative: 0 %
Eosinophils Absolute: 0 10*3/uL (ref 0.0–0.5)
Eosinophils Relative: 0 %
HCT: 39.9 % (ref 36.0–46.0)
Hemoglobin: 13.7 g/dL (ref 12.0–15.0)
Immature Granulocytes: 1 %
Lymphocytes Relative: 11 %
Lymphs Abs: 0.8 10*3/uL (ref 0.7–4.0)
MCH: 31.4 pg (ref 26.0–34.0)
MCHC: 34.3 g/dL (ref 30.0–36.0)
MCV: 91.5 fL (ref 80.0–100.0)
Monocytes Absolute: 0.2 10*3/uL (ref 0.1–1.0)
Monocytes Relative: 3 %
Neutro Abs: 6.8 10*3/uL (ref 1.7–7.7)
Neutrophils Relative %: 85 %
Platelets: 290 10*3/uL (ref 150–400)
RBC: 4.36 MIL/uL (ref 3.87–5.11)
RDW: 13.3 % (ref 11.5–15.5)
WBC: 7.9 10*3/uL (ref 4.0–10.5)
nRBC: 0 % (ref 0.0–0.2)

## 2023-11-27 LAB — URINALYSIS, W/ REFLEX TO CULTURE (INFECTION SUSPECTED)
Bilirubin Urine: NEGATIVE
Glucose, UA: NEGATIVE mg/dL
Hgb urine dipstick: NEGATIVE
Ketones, ur: 80 mg/dL — AB
Leukocytes,Ua: NEGATIVE
Nitrite: NEGATIVE
Protein, ur: 30 mg/dL — AB
Specific Gravity, Urine: 1.02 (ref 1.005–1.030)
pH: 6 (ref 5.0–8.0)

## 2023-11-27 LAB — TROPONIN I (HIGH SENSITIVITY): Troponin I (High Sensitivity): 5 ng/L (ref <18)

## 2023-11-27 LAB — HCG, SERUM, QUALITATIVE: Preg, Serum: NEGATIVE

## 2023-11-27 LAB — LIPASE, BLOOD: Lipase: 25 U/L (ref 11–51)

## 2023-11-27 MED ORDER — SODIUM CHLORIDE 0.9 % IV BOLUS
1000.0000 mL | Freq: Once | INTRAVENOUS | Status: AC
  Administered 2023-11-27: 1000 mL via INTRAVENOUS

## 2023-11-27 MED ORDER — ONDANSETRON 4 MG PO TBDP: 4.0000 mg | ORAL_TABLET | Freq: Once | ORAL | Status: DC

## 2023-11-27 MED ORDER — ONDANSETRON 4 MG PO TBDP: 4.0000 mg | ORAL_TABLET | Freq: Three times a day (TID) | ORAL | 0 refills | Status: DC | PRN

## 2023-11-27 MED ORDER — ONDANSETRON HCL 4 MG/2ML IJ SOLN
4.0000 mg | Freq: Once | INTRAMUSCULAR | Status: AC
  Administered 2023-11-27: 4 mg via INTRAVENOUS
  Filled 2023-11-27: qty 2

## 2023-11-27 MED ORDER — IOHEXOL 300 MG/ML  SOLN
100.0000 mL | Freq: Once | INTRAMUSCULAR | Status: AC | PRN
  Administered 2023-11-27: 100 mL via INTRAVENOUS

## 2023-11-27 MED ORDER — MORPHINE SULFATE (PF) 4 MG/ML IV SOLN
4.0000 mg | Freq: Once | INTRAVENOUS | Status: AC
  Administered 2023-11-27: 4 mg via INTRAVENOUS
  Filled 2023-11-27: qty 1

## 2023-11-27 MED ORDER — ONDANSETRON HCL 4 MG/2ML IJ SOLN
4.0000 mg | Freq: Once | INTRAMUSCULAR | Status: AC
Start: 2023-11-27 — End: 2023-11-27
  Administered 2023-11-27: 4 mg via INTRAMUSCULAR

## 2023-11-27 NOTE — ED Notes (Signed)
 Patient is being discharged from the Urgent Care and sent to the Emergency Department via private vehicle . Per Cleveland Dales Mecum PA, patient is in need of higher level of care due to abdominal pain, nausea, and vomiting. Patient is aware and verbalizes understanding of plan of care.   Vitals:   11/27/23 0913  BP: 116/76  Pulse: (!) 108  Resp: 16  Temp: 99.4 F (37.4 C)  SpO2: 98%

## 2023-11-27 NOTE — ED Provider Notes (Signed)
 [place] EMERGENCY DEPARTMENT AT [facility] Provider Note   CSN: [id number] Arrival date & time: 11/27/23  1043     Patient presents with: No chief complaint on file.   Vanessa Sampson is a 43 y.o. female with PMHx anxiety, depression, constipation, who presents to ED concerned for nausea, vomiting, and abdominal pain since 9PM last night. Family member at bedside stating that patient had a similar episode of abdominal pain and vomiting on Tuesday which resolved. Patient was sent here from UC d/t the abdominal pain to evaluate for further process. Patient stating that pain is mostly in RUQ.  Patient endorses subjective fever/hot sweats.  Patient also endorsing central chest tightness that started shortly after the initial emesis.  Denies dyspnea, cough, hematemesis, hematochezia, diarrhea.       HPI     Prior to Admission medications   Medication Sig Start Date End Date Taking? Authorizing Provider  ondansetron  (ZOFRAN -ODT) 4 MG disintegrating tablet Take 1 tablet (4 mg total) by mouth every 8 (eight) hours as needed for nausea. 11/27/23  Yes Dorisann Garre F, PA-C  buPROPion  (WELLBUTRIN  XL) 300 MG 24 hr tablet Take 1 tablet (300 mg total) by mouth daily. 08/16/23   Copland, Skipper Dumas, MD  FLUoxetine  (PROZAC ) 40 MG capsule Take 1 capsule (40 mg total) by mouth daily. 08/25/23   Copland, Skipper Dumas, MD  linaclotide  (LINZESS ) 290 MCG CAPS capsule Take 1 capsule (290 mcg total) by mouth daily before breakfast. 08/19/23   Copland, Jessica C, MD  LORazepam  (ATIVAN ) 1 MG tablet Take 1 tablet (1 mg total) by mouth 2 (two) times daily as needed for anxiety. 09/15/23   Copland, Jessica C, MD  Multiple Vitamins-Minerals (MULTIVITAMIN ADULT PO) Take 1 tablet by mouth daily. Women's Health    [provider]    Allergies: Gluten meal, Mosquito (culex pipiens) allergy skin test, and Latex    Review of Systems  Gastrointestinal:  Positive for abdominal pain.    Updated Vital  Signs BP 97/64   Pulse (!) 107   Temp 98.4 F (36.9 C) (Oral)   Resp 18   Ht 5' 5 (1.651 m)   Wt 65.8 kg   LMP 11/20/2023 (Exact Date)   SpO2 100%   BMI 24.13 kg/m   Physical Exam Vitals and nursing note reviewed.  Constitutional:      General: She is not in acute distress.    Appearance: She is not ill-appearing or toxic-appearing.  HENT:     Head: Normocephalic and atraumatic.     Mouth/Throat:     Mouth: Mucous membranes are moist.   Eyes:     General: No scleral icterus.       Right eye: No discharge.        Left eye: No discharge.     Conjunctiva/sclera: Conjunctivae normal.    Cardiovascular:     Rate and Rhythm: Normal rate and regular rhythm.     Pulses: Normal pulses.     Heart sounds: Normal heart sounds. No murmur heard. Pulmonary:     Effort: Pulmonary effort is normal. No respiratory distress.     Breath sounds: Normal breath sounds. No wheezing, rhonchi or rales.  Abdominal:     General: Abdomen is flat. There is no distension.     Palpations: Abdomen is soft. There is no mass.     Tenderness: There is abdominal tenderness.     Comments: Generalized abdominal tenderness   Musculoskeletal:     Right lower leg:  No edema.     Left lower leg: No edema.   Skin:    General: Skin is warm and dry.     Findings: No rash.   Neurological:     General: No focal deficit present.     Mental Status: She is alert and oriented to person, place, and time. Mental status is at baseline.   Psychiatric:        Mood and Affect: Mood normal.        Behavior: Behavior normal.     (all labs ordered are listed, but only abnormal results are displayed) Labs Reviewed  COMPREHENSIVE METABOLIC PANEL WITH GFR - Abnormal; Notable for the following components:      Result Value   Sodium 132 (*)    CO2 16 (*)    Glucose, Bld 103 (*)    Total Protein 8.2 (*)    Total Bilirubin 1.4 (*)    All other components within normal limits  URINALYSIS, W/ REFLEX TO CULTURE  (INFECTION SUSPECTED) - Abnormal; Notable for the following components:   Ketones, ur 80 (*)    Protein, ur 30 (*)    Bacteria, UA RARE (*)    All other components within normal limits  I-STAT CHEM 8, ED - Abnormal; Notable for the following components:   Glucose, Bld 102 (*)    Calcium, Ion 1.12 (*)    TCO2 18 (*)    All other components within normal limits  LIPASE, BLOOD  CBC WITH DIFFERENTIAL/PLATELET  HCG, SERUM, QUALITATIVE  TROPONIN I (HIGH SENSITIVITY)    EKG: None  Radiology: CT ABDOMEN PELVIS W CONTRAST Result Date: 11/27/2023 CLINICAL DATA:  Acute abdominal pain EXAM: CT ABDOMEN AND PELVIS WITH CONTRAST TECHNIQUE: Multidetector CT imaging of the abdomen and pelvis was performed using the standard protocol following bolus administration of intravenous contrast. RADIATION DOSE REDUCTION: This exam was performed according to the departmental dose-optimization program which includes automated exposure control, adjustment of the mA and/or kV according to patient size and/or use of iterative reconstruction technique. CONTRAST:  OMNIPAQUE IOHEXOL 300 MG/ML  SOLN COMPARISON:  11/27/2023 FINDINGS: Lower chest: No acute pleural or parenchymal lung disease. Incidental 3 mm left upper lobe pulmonary nodule reference image 2/6. Hepatobiliary: No focal liver abnormality is seen. No gallstones, gallbladder wall thickening, or biliary dilatation. Pancreas: Unremarkable. No pancreatic ductal dilatation or surrounding inflammatory changes. Spleen: Normal in size without focal abnormality. Adrenals/Urinary Tract: Kidneys enhance normally and symmetrically. No urinary tract calculi or obstructive uropathy within either kidney. The adrenals and bladder appear unremarkable. Stomach/Bowel: No bowel obstruction or ileus. The appendix is surgically absent. No bowel wall thickening or inflammatory change. Vascular/Lymphatic: No significant vascular findings are present. No enlarged abdominal or pelvic  lymph nodes. Reproductive: Uterus and bilateral adnexa are unremarkable. Other: No free fluid or free intraperitoneal gas. No abdominal wall hernia. Musculoskeletal: No acute or destructive bony abnormalities. Reconstructed images demonstrate no additional findings. IMPRESSION: 1. No acute intra-abdominal or intrapelvic process. 2. Left solid pulmonary nodule within the upper lobe measuring 3 mm. Per Fleischner Society Guidelines, if patient is low risk for malignancy, no routine follow-up imaging is recommended. If patient is high risk for malignancy, a non-contrast Chest CT at 12 months is optional. If performed and the nodule is stable at 12 months, no further follow-up is recommended. These guidelines do not apply to immunocompromised patients and patients with cancer. Follow up in patients with significant comorbidities as clinically warranted. For lung cancer screening, adhere to Lung-RADS  guidelines. Reference: Radiology. 2017; 284(1):228-43. Electronically Signed   By: Bobbye Burrow M.D.   On: 11/27/2023 13:23   US  Abdomen Limited RUQ (LIVER/GB) Result Date: 11/27/2023 CLINICAL DATA:  151471 RUQ pain 151471 EXAM: ULTRASOUND ABDOMEN LIMITED RIGHT UPPER QUADRANT COMPARISON:  Ultrasound abdomen 02/27/2021 FINDINGS: Gallbladder: No gallstones or wall thickening visualized. No sonographic Murphy sign noted by sonographer. Common bile duct: Diameter: 4 mm. Liver: No focal lesion identified. Within normal limits in parenchymal echogenicity. Portal vein is patent on color Doppler imaging with normal direction of blood flow towards the liver. Other: None. IMPRESSION: Unremarkable right upper quadrant ultrasound. Electronically Signed   By: Morgane  Naveau M.D.   On: 11/27/2023 12:37     Procedures   Medications Ordered in the ED  ondansetron  (ZOFRAN ) injection 4 mg (4 mg Intravenous Given 11/27/23 1159)  sodium chloride  0.9 % bolus 1,000 mL (0 mLs Intravenous Stopped 11/27/23 1341)  morphine (PF) 4 MG/ML  injection 4 mg (4 mg Intravenous Given 11/27/23 1200)  iohexol (OMNIPAQUE) 300 MG/ML solution 100 mL (100 mLs Intravenous Contrast Given 11/27/23 1304)  sodium chloride  0.9 % bolus 1,000 mL (1,000 mLs Intravenous New Bag/Given 11/27/23 1348)                                    Medical Decision Making Amount and/or Complexity of Data Reviewed Labs: ordered. Radiology: ordered.  Risk Prescription drug management.    This patient presents to the ED for concern of abdominal pain, this involves an extensive number of treatment options, and is a complaint that carries with it a high risk of complications and morbidity.  The differential diagnosis includes gastroenteritis, colitis, small bowel obstruction, appendicitis, cholecystitis, pancreatitis, nephrolithiasis, UTI, pyelonephritis   Co morbidities that complicate the patient evaluation  anxiety, depression, constipation   Additional history obtained:  Dr. Geralyn Knee PCP   Problem List / ED Course / Critical interventions / Medication management  Patient presented for nausea, vomiting, abdominal pain since 9PM last night.  Patient stating that his abdominal pain is in the RUQ.  Physical exam with generalized abdominal tenderness to palpation. There is also some chest wall tenderness to palpation. Patient also tachycardic around 107 bpm on initial interview.  Rest of physical exam reassuring. I Ordered, and personally interpreted labs.  UA not concerning for infection.  hCG negative.  Troponin within normal limits.  CBC without leukocytosis or anemia.  CMP with mild hyponatremia 132.  T. bili is also mildly elevated at 1.4.  Lipase within normal limits. I ordered imaging studies including RUQ US  and CT Abd/Pelvis with contrast: evaluate for structural/surgical etiology of patients' severe abdominal pain.  I independently visualized and interpreted imaging and I agree with the radiologist interpretation of no acute process.  Patient educated  about the pulmonary nodule found on the CT scan and understands to follow-up with PCP. I personally viewed and interpreted the EKG/cardiac monitored which showed an underlying rhythm of: Sinus tachycardia Shared all results with patient.  Answered all questions.  Patient feeling a lot better after Zofran , morphine, and IV fluids.  Patient now tolerating PO and is ready to go home. HR documented coming down to 102 and patient able to tolerate fluids so I feel that its appropriate to continue oral rehydration at home - patient stating that she saw her HR go down to 93 and she is very ready to go home. I recommend following up with  PCP. I have reviewed the patients home medicines and have made adjustments as needed The patient has been appropriately medically screened and/or stabilized in the ED. I have low suspicion for any other emergent medical condition which would require further screening, evaluation or treatment in the ED or require inpatient management. At time of discharge the patient is hemodynamically stable and in no acute distress. I have discussed work-up results and diagnosis with patient and answered all questions. Patient is agreeable with discharge plan. We discussed strict return precautions for returning to the emergency department and they verbalized understanding.     Social Determinants of Health:  none       Final diagnoses:  Nausea and vomiting, unspecified vomiting type    ED Discharge Orders          Ordered    ondansetron  (ZOFRAN -ODT) 4 MG disintegrating tablet  Every 8 hours PRN        11/27/23 1424               [place], [place] 11/27/23 1512    Merdis Stalling, MD 11/28/23 [phone]

## 2023-11-27 NOTE — ED Triage Notes (Signed)
 Patient complains of N/V since 9pm last night. Complains of chest discomfort- pressure, rates 7/10. No cardiac history. Last time vomited about one hour ago.

## 2023-11-27 NOTE — Discharge Instructions (Addendum)
 I am glad you are feeling better.  Please follow-up with your primary care provider.  Seek emergency care experiencing any new or worsening symptoms.

## 2023-11-27 NOTE — ED Provider Notes (Signed)
 [name]    CSN: [id number] Arrival date & time: 11/27/23  0901      History   Chief Complaint Chief Complaint  Patient presents with   Emesis    HPI Vanessa Sampson is a 43 y.o. female.   HPI  Pt reports concerns for vomiting that has been ongoing since 8 or 9 pm last night She is here with her husband who states that she has not been able to keep liquids down since vomiting started  She reports fever and chills but is not sure how high this has been  She reports dull, constant generalized abdominal pain since last night  She reports last bowel movement was about 3 days ago. She reports this is typical for her  Pt has not been passing gas but states she feels like she needs to   Pt reports previous hx of appendectomy but denies other surgeries to abdomen   Past Medical History:  Diagnosis Date   Anxiety     Patient Active Problem List   Diagnosis Date Noted   Urinary tract infection with hematuria 08/10/2021   Polydipsia 08/10/2021   Pre-diabetes 04/03/2019   Visit for preventive health examination 03/26/2015   Anxiety and depression 10/10/2014   Chronic idiopathic constipation 10/10/2014    Past Surgical History:  Procedure Laterality Date   APPENDECTOMY  2011   WISDOM TOOTH EXTRACTION      OB History   No obstetric history on file.      Home Medications    Prior to Admission medications   Medication Sig Start Date End Date Taking? Authorizing Provider  buPROPion  (WELLBUTRIN  XL) 300 MG 24 hr tablet Take 1 tablet (300 mg total) by mouth daily. 08/16/23   Copland, Skipper Dumas, MD  FLUoxetine  (PROZAC ) 40 MG capsule Take 1 capsule (40 mg total) by mouth daily. 08/25/23   Copland, Skipper Dumas, MD  linaclotide  (LINZESS ) 290 MCG CAPS capsule Take 1 capsule (290 mcg total) by mouth daily before breakfast. 08/19/23   Copland, Skipper Dumas, MD  LORazepam  (ATIVAN ) 1 MG tablet Take 1 tablet (1 mg total) by mouth 2 (two) times daily as needed for anxiety. 09/15/23    Copland, Jessica C, MD  Multiple Vitamins-Minerals (MULTIVITAMIN ADULT PO) Take 1 tablet by mouth daily. Women's Health    [provider]    Family History Family History  Problem Relation Age of Onset   Fibromyalgia Father    Hypertension Maternal Grandmother     Social History Social History   Tobacco Use   Smoking status: Former   Smokeless tobacco: Never  Vaping Use   Vaping status: Never Used  Substance Use Topics   Alcohol use: Yes    Alcohol/week: 0.0 standard drinks of alcohol   Drug use: No     Allergies   Gluten meal, Mosquito (culex pipiens) allergy skin test, and Latex   Review of Systems Review of Systems  Constitutional:  Positive for chills, diaphoresis and fever.  Gastrointestinal:  Positive for abdominal pain (generalized), constipation, nausea and vomiting.     Physical Exam Triage Vital Signs ED Triage Vitals  Encounter Vitals Group     BP 11/27/23 0913 116/76     Girls Systolic BP Percentile --      Girls Diastolic BP Percentile --      Boys Systolic BP Percentile --      Boys Diastolic BP Percentile --      Pulse Rate 11/27/23 0913 (!) 108  Resp 11/27/23 0913 16     Temp 11/27/23 0913 99.4 F (37.4 C)     Temp Source 11/27/23 0913 Oral     SpO2 11/27/23 0913 98 %     Weight 11/27/23 0915 145 lb (65.8 kg)     Height 11/27/23 0915 5' 5 (1.651 m)     Head Circumference --      Peak Flow --      Pain Score 11/27/23 0914 7     Pain Loc --      Pain Education --      Exclude from Growth Chart --    No data found.  Updated Vital Signs BP 116/76 (BP Location: Right Arm)   Pulse (!) 108   Temp 99.4 F (37.4 C) (Oral)   Resp 16   Ht 5' 5 (1.651 m)   Wt 145 lb (65.8 kg)   LMP 11/20/2023 (Exact Date)   SpO2 98%   BMI 24.13 kg/m   Visual Acuity Right Eye Distance:   Left Eye Distance:   Bilateral Distance:    Right Eye Near:   Left Eye Near:    Bilateral Near:     Physical Exam Vitals reviewed.   Constitutional:      General: She is awake. She is in acute distress.     Appearance: She is well-developed and well-groomed. She is ill-appearing. She is not toxic-appearing.  HENT:     Head: Normocephalic and atraumatic.   Cardiovascular:     Rate and Rhythm: Regular rhythm. Tachycardia present.     Heart sounds: Normal heart sounds. No murmur heard.    No friction rub. No gallop.  Pulmonary:     Effort: Pulmonary effort is normal.     Breath sounds: Normal breath sounds. No decreased air movement. No decreased breath sounds, wheezing, rhonchi or rales.  Abdominal:     General: Abdomen is flat. Bowel sounds are absent.     Palpations: Abdomen is soft.     Tenderness: There is generalized abdominal tenderness. There is guarding. Negative signs include Murphy's sign, Rovsing's sign and McBurney's sign.   Musculoskeletal:     Cervical back: Normal range of motion and neck supple.   Neurological:     General: No focal deficit present.     Mental Status: She is alert and oriented to person, place, and time.   Psychiatric:        Mood and Affect: Mood normal.        Behavior: Behavior normal. Behavior is cooperative.        Thought Content: Thought content normal.      UC Treatments / Results  Labs (all labs ordered are listed, but only abnormal results are displayed) Labs Reviewed - No data to display  EKG   Radiology No results found.  Procedures ED EKG  Date/Time: 11/27/2023 9:38 AM  Performed by: Jerona Mooring, PA-C Authorized by: Jerona Mooring, PA-C   Previous ECG:    Previous ECG:  Compared to current   Comparison ECG info:  03/29/2023 Interpretation:    Interpretation: normal     Details:  Sinus tachycardia Rate:    ECG rate:  100   ECG rate assessment: tachycardic   Rhythm:    Rhythm: sinus tachycardia   ST segments:    ST segments:  Normal  (including critical care time)  Medications Ordered in UC Medications  ondansetron  (ZOFRAN ) injection 4  mg (has no administration in time range)  Initial Impression / Assessment and Plan / UC Course  I have reviewed the triage vital signs and the nursing notes.  Pertinent labs & imaging results that were available during my care of the patient were reviewed by me and considered in my medical decision making (see chart for details).      Final Clinical Impressions(s) / UC Diagnoses   Final diagnoses:  Generalized abdominal pain  Nausea and vomiting, unspecified vomiting type   Pt presents today with concerns for generalized abdominal pain, nausea and vomiting that has been ongoing since last night. She reports her last bowel movement was about 3 days ago and she has not been passing gas. She is diaphoretic on exam and tachycardic. Her EKG demonstrates sinus tachycardia but is otherwise unremarkable at this time. Abdominal exam reveals mild tenderness to palpation and absent bowel sounds. At this time I am concerned for acute abdomen and recommend ED for prompt imaging, labs, and management. Differential includes but not limited to : bowel obstruction, pancreatitis, cholelithiasis, mesenteric adenitis.  Reviewed with patient and her husband that they should seek prompt emergency room care for more definitive evaluation. They are amenable to ED care and state they will go via private vehicle, they decline EMS transportation. Zofran  4 mg IM given to assist with nausea and vomiting during transport.     Discharge Instructions      You were seen today for concerns of abdominal pain and nausea/ vomiting Based on your symptoms and exam I am concerned that you may be having an abdominal emergency that needs a higher level of care. We have provided you with a Zofran  4 mg injection to assist with your nausea and vomiting while you are en route to the ED. Please go to the closest ED for further evaluation and ongoing management. If you are not able to make the trip please pull over and call 911 for  assistance.      ED Prescriptions   None    PDMP not reviewed this encounter.   Yoko Mcgahee, Pearla [place], PA-C 11/27/23 1006

## 2023-11-27 NOTE — ED Triage Notes (Addendum)
 Pt presents with complaints of nausea/vomiting since last night 6/13. Vomiting started at approximately 9PM. Pt states her chest feels tight and she feels very sweaty. Unsure of fevers at home. Pt currently rates her overall pain a 7/10. OTC Gas-X and Tums taken with no relief. Pt is nauseous in triage. Abdominal pain reported as well (generalized, constant).

## 2023-11-27 NOTE — Discharge Instructions (Signed)
 You were seen today for concerns of abdominal pain and nausea/ vomiting Based on your symptoms and exam I am concerned that you may be having an abdominal emergency that needs a higher level of care. We have provided you with a Zofran  4 mg injection to assist with your nausea and vomiting while you are en route to the ED. Please go to the closest ED for further evaluation and ongoing management. If you are not able to make the trip please pull over and call 911 for assistance.

## 2023-11-27 NOTE — ED Notes (Signed)
 Pt drank some of her personal tea and so far has been keeping it down. Denies nausea, discomfort.

## 2023-11-29 ENCOUNTER — Encounter: Payer: Self-pay | Admitting: Family Medicine

## 2023-12-02 ENCOUNTER — Other Ambulatory Visit (HOSPITAL_BASED_OUTPATIENT_CLINIC_OR_DEPARTMENT_OTHER): Payer: Self-pay

## 2023-12-02 ENCOUNTER — Other Ambulatory Visit: Payer: Self-pay | Admitting: Family Medicine

## 2023-12-02 DIAGNOSIS — F419 Anxiety disorder, unspecified: Secondary | ICD-10-CM

## 2023-12-02 DIAGNOSIS — F32A Depression, unspecified: Secondary | ICD-10-CM

## 2023-12-02 MED ORDER — LORAZEPAM 1 MG PO TABS
1.0000 mg | ORAL_TABLET | Freq: Two times a day (BID) | ORAL | 3 refills | Status: DC | PRN
  Filled 2023-12-02 – 2023-12-06 (×2): qty 45, 23d supply, fill #0
  Filled 2023-12-26: qty 45, 23d supply, fill #1
  Filled 2024-01-11 – 2024-01-14 (×2): qty 45, 23d supply, fill #2
  Filled 2024-01-30 – 2024-02-04 (×2): qty 45, 23d supply, fill #3

## 2023-12-06 ENCOUNTER — Other Ambulatory Visit (HOSPITAL_BASED_OUTPATIENT_CLINIC_OR_DEPARTMENT_OTHER): Payer: Self-pay

## 2023-12-11 ENCOUNTER — Other Ambulatory Visit (HOSPITAL_BASED_OUTPATIENT_CLINIC_OR_DEPARTMENT_OTHER): Payer: Self-pay

## 2023-12-11 MED ORDER — OXYCODONE HCL 5 MG PO TABS
5.0000 mg | ORAL_TABLET | ORAL | 0 refills | Status: DC | PRN
  Filled 2023-12-11: qty 20, 4d supply, fill #0

## 2023-12-11 MED ORDER — ONDANSETRON 4 MG PO TBDP
4.0000 mg | ORAL_TABLET | ORAL | 0 refills | Status: DC | PRN
  Filled 2023-12-11: qty 10, 2d supply, fill #0

## 2023-12-13 ENCOUNTER — Other Ambulatory Visit (HOSPITAL_BASED_OUTPATIENT_CLINIC_OR_DEPARTMENT_OTHER): Payer: Self-pay

## 2023-12-26 ENCOUNTER — Other Ambulatory Visit (HOSPITAL_BASED_OUTPATIENT_CLINIC_OR_DEPARTMENT_OTHER): Payer: Self-pay

## 2023-12-27 ENCOUNTER — Other Ambulatory Visit: Payer: Self-pay

## 2023-12-27 ENCOUNTER — Other Ambulatory Visit (HOSPITAL_BASED_OUTPATIENT_CLINIC_OR_DEPARTMENT_OTHER): Payer: Self-pay

## 2023-12-27 MED ORDER — OXYCODONE HCL 5 MG PO TABS
5.0000 mg | ORAL_TABLET | ORAL | 0 refills | Status: DC | PRN
  Filled 2023-12-27: qty 20, 4d supply, fill #0

## 2023-12-28 ENCOUNTER — Other Ambulatory Visit (HOSPITAL_BASED_OUTPATIENT_CLINIC_OR_DEPARTMENT_OTHER): Payer: Self-pay

## 2023-12-30 ENCOUNTER — Other Ambulatory Visit (HOSPITAL_BASED_OUTPATIENT_CLINIC_OR_DEPARTMENT_OTHER): Payer: Self-pay

## 2023-12-31 ENCOUNTER — Other Ambulatory Visit (HOSPITAL_BASED_OUTPATIENT_CLINIC_OR_DEPARTMENT_OTHER): Payer: Self-pay

## 2023-12-31 ENCOUNTER — Ambulatory Visit: Payer: Self-pay
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
## 2024-01-01 ENCOUNTER — Other Ambulatory Visit (HOSPITAL_BASED_OUTPATIENT_CLINIC_OR_DEPARTMENT_OTHER): Payer: Self-pay

## 2024-01-03 ENCOUNTER — Other Ambulatory Visit (HOSPITAL_BASED_OUTPATIENT_CLINIC_OR_DEPARTMENT_OTHER): Payer: Self-pay

## 2024-01-03 ENCOUNTER — Telehealth (INDEPENDENT_AMBULATORY_CARE_PROVIDER_SITE_OTHER): Admitting: Family Medicine

## 2024-01-03 DIAGNOSIS — F419 Anxiety disorder, unspecified: Secondary | ICD-10-CM

## 2024-01-03 MED ORDER — HYDROXYZINE PAMOATE 25 MG PO CAPS: 25.0000 mg | ORAL_CAPSULE | Freq: Three times a day (TID) | ORAL | 0 refills | Status: DC | PRN

## 2024-01-04 ENCOUNTER — Other Ambulatory Visit (HOSPITAL_BASED_OUTPATIENT_CLINIC_OR_DEPARTMENT_OTHER): Payer: Self-pay

## 2024-01-05 ENCOUNTER — Other Ambulatory Visit (HOSPITAL_BASED_OUTPATIENT_CLINIC_OR_DEPARTMENT_OTHER): Payer: Self-pay

## 2024-01-05 ENCOUNTER — Encounter: Payer: Self-pay | Admitting: Family Medicine

## 2024-01-06 ENCOUNTER — Other Ambulatory Visit (HOSPITAL_BASED_OUTPATIENT_CLINIC_OR_DEPARTMENT_OTHER): Payer: Self-pay

## 2024-01-07 ENCOUNTER — Other Ambulatory Visit (HOSPITAL_BASED_OUTPATIENT_CLINIC_OR_DEPARTMENT_OTHER): Payer: Self-pay

## 2024-01-10 ENCOUNTER — Other Ambulatory Visit (HOSPITAL_BASED_OUTPATIENT_CLINIC_OR_DEPARTMENT_OTHER): Payer: Self-pay

## 2024-01-11 ENCOUNTER — Other Ambulatory Visit: Payer: Self-pay

## 2024-01-11 ENCOUNTER — Other Ambulatory Visit (HOSPITAL_BASED_OUTPATIENT_CLINIC_OR_DEPARTMENT_OTHER): Payer: Self-pay

## 2024-01-12 ENCOUNTER — Other Ambulatory Visit (HOSPITAL_BASED_OUTPATIENT_CLINIC_OR_DEPARTMENT_OTHER): Payer: Self-pay

## 2024-01-13 ENCOUNTER — Other Ambulatory Visit (HOSPITAL_BASED_OUTPATIENT_CLINIC_OR_DEPARTMENT_OTHER): Payer: Self-pay

## 2024-01-14 ENCOUNTER — Other Ambulatory Visit: Payer: Self-pay | Admitting: Family Medicine

## 2024-01-14 ENCOUNTER — Other Ambulatory Visit (HOSPITAL_BASED_OUTPATIENT_CLINIC_OR_DEPARTMENT_OTHER): Payer: Self-pay

## 2024-01-14 DIAGNOSIS — F419 Anxiety disorder, unspecified: Secondary | ICD-10-CM

## 2024-01-17 ENCOUNTER — Other Ambulatory Visit: Payer: Self-pay | Admitting: Family Medicine

## 2024-01-17 ENCOUNTER — Other Ambulatory Visit (HOSPITAL_BASED_OUTPATIENT_CLINIC_OR_DEPARTMENT_OTHER): Payer: Self-pay

## 2024-01-17 DIAGNOSIS — F419 Anxiety disorder, unspecified: Secondary | ICD-10-CM

## 2024-01-18 ENCOUNTER — Other Ambulatory Visit (HOSPITAL_BASED_OUTPATIENT_CLINIC_OR_DEPARTMENT_OTHER): Payer: Self-pay

## 2024-01-19 ENCOUNTER — Other Ambulatory Visit (HOSPITAL_BASED_OUTPATIENT_CLINIC_OR_DEPARTMENT_OTHER): Payer: Self-pay

## 2024-01-20 ENCOUNTER — Other Ambulatory Visit (HOSPITAL_BASED_OUTPATIENT_CLINIC_OR_DEPARTMENT_OTHER): Payer: Self-pay

## 2024-01-21 ENCOUNTER — Other Ambulatory Visit (HOSPITAL_BASED_OUTPATIENT_CLINIC_OR_DEPARTMENT_OTHER): Payer: Self-pay

## 2024-01-24 ENCOUNTER — Other Ambulatory Visit (HOSPITAL_BASED_OUTPATIENT_CLINIC_OR_DEPARTMENT_OTHER): Payer: Self-pay

## 2024-01-25 ENCOUNTER — Other Ambulatory Visit (HOSPITAL_BASED_OUTPATIENT_CLINIC_OR_DEPARTMENT_OTHER): Payer: Self-pay

## 2024-01-26 ENCOUNTER — Other Ambulatory Visit (HOSPITAL_BASED_OUTPATIENT_CLINIC_OR_DEPARTMENT_OTHER): Payer: Self-pay

## 2024-01-27 ENCOUNTER — Other Ambulatory Visit (HOSPITAL_BASED_OUTPATIENT_CLINIC_OR_DEPARTMENT_OTHER): Payer: Self-pay

## 2024-01-28 ENCOUNTER — Other Ambulatory Visit (HOSPITAL_BASED_OUTPATIENT_CLINIC_OR_DEPARTMENT_OTHER): Payer: Self-pay

## 2024-01-31 ENCOUNTER — Other Ambulatory Visit (HOSPITAL_BASED_OUTPATIENT_CLINIC_OR_DEPARTMENT_OTHER): Payer: Self-pay

## 2024-02-01 ENCOUNTER — Ambulatory Visit: Payer: Self-pay

## 2024-02-01 ENCOUNTER — Other Ambulatory Visit (HOSPITAL_COMMUNITY): Payer: Self-pay

## 2024-02-01 ENCOUNTER — Other Ambulatory Visit (HOSPITAL_BASED_OUTPATIENT_CLINIC_OR_DEPARTMENT_OTHER): Payer: Self-pay

## 2024-02-01 NOTE — Telephone Encounter (Signed)
 Pt was disconnected prior to transfer to nurse. RN attempted call back, LVMTCB.  Copied from CRM #[id number]. Topic: Clinical - Red Word Triage >> Feb 01, 2024  8:44 AM Vanessa Sampson wrote: Red Word that prompted transfer to Nurse Triage: Medication LORazepam  (ATIVAN ) 1 MG tablet not working. Mentioned Melt down for the past couple days due to anxiety.

## 2024-02-01 NOTE — Telephone Encounter (Signed)
 FYI Only or Action Required?: FYI only for provider.  Patient was last seen in primary care on 01/03/2024 by Copland, Harlene BROCKS, MD.  Called Nurse Triage reporting Anxiety.  Symptoms are: worsening.  Triage Disposition: See PCP When Office is Open (Within 3 Days), No Contact Calls  Patient/caregiver understands and will follow disposition?:         Reason for Disposition  MODERATE anxiety (e.g., persistent or frequent anxiety symptoms; interferes with sleep, school, or work)  Answer Assessment - Initial Assessment Questions 1. CONCERN: Did anything happen that prompted you to call today?      Increased anxiety, out of her Ativan   2. ANXIETY SYMPTOMS: Can you describe how you (your loved one; patient) have been feeling? (e.g., tense, restless, panicky, anxious, keyed up, overwhelmed, sense of impending doom).      Anxious and low energy  3. ONSET: How long have you been feeling this way? (e.g., hours, days, weeks)     3 days ago  4. SEVERITY: How would you rate the level of anxiety? (e.g., 0 - 10; or mild, moderate, severe).     Moderate  5. FUNCTIONAL IMPAIRMENT: How have these feelings affected your ability to do daily activities? Have you had more difficulty than usual doing your normal daily activities? (e.g., getting better, same, worse; self-care, school, work, interactions)     Low energy  6. HISTORY: Have you felt this way before? Have you ever been diagnosed with an anxiety problem in the past? (e.g., generalized anxiety disorder, panic attacks, PTSD). If Yes, ask: How was this problem treated? (e.g., medicines, counseling, etc.)     Yes 7. RISK OF HARM - SUICIDAL IDEATION: Do you ever have thoughts of hurting or killing yourself? If Yes, ask:  Do you have these feelings now? Do you have a plan on how you would do this?     No 8. TREATMENT:  What has been done so far to treat this anxiety? (e.g., medicines, relaxation strategies). What has  helped?     Out of medication  9. THERAPIST: Do you have a counselor or therapist? If Yes, ask: What is their name?     No 12. OTHER SYMPTOMS: Do you have any other symptoms? (e.g., feeling depressed, trouble concentrating, trouble sleeping, trouble breathing, palpitations or fast heartbeat, chest pain, sweating, nausea, or diarrhea)       Vomiting last night  Protocols used: Anxiety and Panic Attack-A-AH
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
## 2024-02-01 NOTE — Telephone Encounter (Addendum)
 FYI Only or Action Required?: LORazepam  (ATIVAN ) 1 MG tablet not working. Mentioned Melt down for the past couple days due to anxiety.  Patient was last seen in primary care on 01/03/2024 by Copland, Harlene BROCKS, MD.  Called Nurse Triage reporting No chief complaint on file..  Symptoms began today.  Interventions attempted: Nothing.  Symptoms are: stable.  Triage Disposition: No disposition on file.  Patient/caregiver understands and will follow disposition?:   Third attempt; no answer; forwarded to office  Second attempt; no answer  Pt was disconnected prior to transfer to nurse. RN attempted call back, LVMTCB.  Copied from CRM #[id number]. Topic: Clinical - Red Word Triage >> Feb 01, 2024  8:44 AM Deaijah H wrote: Red Word that prompted transfer to Nurse Triage: Medication LORazepam  (ATIVAN ) 1 MG tablet not working. Mentioned Melt down for the past couple days due to anxiety.

## 2024-02-02 ENCOUNTER — Telehealth: Admitting: Family Medicine

## 2024-02-02 ENCOUNTER — Ambulatory Visit: Admitting: Family Medicine

## 2024-02-02 ENCOUNTER — Other Ambulatory Visit (HOSPITAL_BASED_OUTPATIENT_CLINIC_OR_DEPARTMENT_OTHER): Payer: Self-pay

## 2024-02-02 ENCOUNTER — Encounter: Payer: Self-pay | Admitting: Family Medicine

## 2024-02-02 VITALS — Ht 65.0 in

## 2024-02-02 DIAGNOSIS — F410 Panic disorder [episodic paroxysmal anxiety] without agoraphobia: Secondary | ICD-10-CM | POA: Diagnosis not present

## 2024-02-02 DIAGNOSIS — F419 Anxiety disorder, unspecified: Secondary | ICD-10-CM

## 2024-02-02 DIAGNOSIS — F32A Depression, unspecified: Secondary | ICD-10-CM

## 2024-02-02 MED ORDER — ONDANSETRON 4 MG PO TBDP
4.0000 mg | ORAL_TABLET | Freq: Three times a day (TID) | ORAL | 1 refills | Status: AC | PRN
Start: 2024-02-02 — End: ?

## 2024-02-02 MED ORDER — ALPRAZOLAM 0.5 MG PO TABS: 0.2500 mg | ORAL_TABLET | Freq: Two times a day (BID) | ORAL | 1 refills | Status: DC | PRN

## 2024-02-03 ENCOUNTER — Encounter: Payer: Self-pay | Admitting: Family Medicine

## 2024-02-04 ENCOUNTER — Other Ambulatory Visit: Payer: Self-pay

## 2024-02-06 ENCOUNTER — Other Ambulatory Visit: Payer: Self-pay | Admitting: Family Medicine

## 2024-02-06 DIAGNOSIS — F32A Depression, unspecified: Secondary | ICD-10-CM

## 2024-02-07 ENCOUNTER — Other Ambulatory Visit: Payer: Self-pay

## 2024-02-07 ENCOUNTER — Other Ambulatory Visit (HOSPITAL_BASED_OUTPATIENT_CLINIC_OR_DEPARTMENT_OTHER): Payer: Self-pay

## 2024-02-07 ENCOUNTER — Encounter: Payer: Self-pay | Admitting: *Deleted

## 2024-02-07 MED ORDER — FLUOXETINE HCL 40 MG PO CAPS
40.0000 mg | ORAL_CAPSULE | Freq: Every day | ORAL | 0 refills | Status: DC
  Filled 2024-02-07: qty 30, 30d supply, fill #0
  Filled 2024-03-04: qty 30, 30d supply, fill #1
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
## 2024-02-28 ENCOUNTER — Encounter: Payer: Self-pay | Admitting: Family Medicine

## 2024-02-28 ENCOUNTER — Ambulatory Visit: Admitting: Family Medicine

## 2024-02-28 VITALS — BP 108/64 | HR 88 | Ht 65.0 in | Wt 123.4 lb

## 2024-02-28 DIAGNOSIS — F32A Depression, unspecified: Secondary | ICD-10-CM

## 2024-02-28 DIAGNOSIS — L659 Nonscarring hair loss, unspecified: Secondary | ICD-10-CM

## 2024-02-28 DIAGNOSIS — Z13220 Encounter for screening for lipoid disorders: Secondary | ICD-10-CM

## 2024-02-28 DIAGNOSIS — F419 Anxiety disorder, unspecified: Secondary | ICD-10-CM

## 2024-02-28 DIAGNOSIS — R634 Abnormal weight loss: Secondary | ICD-10-CM

## 2024-02-28 DIAGNOSIS — R7303 Prediabetes: Secondary | ICD-10-CM

## 2024-02-28 DIAGNOSIS — Z23 Encounter for immunization: Secondary | ICD-10-CM | POA: Diagnosis not present

## 2024-02-28 LAB — LIPID PANEL
Cholesterol: 264 mg/dL — ABNORMAL HIGH (ref 0–200)
HDL: 48.9 mg/dL (ref >39.00)
LDL Cholesterol: 198 mg/dL — ABNORMAL HIGH (ref 0–99)
NonHDL: 214.91
Total CHOL/HDL Ratio: 5
Triglycerides: 87 mg/dL (ref 0.0–149.0)
VLDL: 17.4 mg/dL (ref 0.0–40.0)

## 2024-02-28 LAB — VITAMIN B12: Vitamin B-12: >1500 pg/mL — ABNORMAL HIGH (ref 211–911)

## 2024-02-28 LAB — COMPREHENSIVE METABOLIC PANEL WITH GFR
ALT: 18 U/L (ref 0–35)
AST: 17 U/L (ref 0–37)
Albumin: 4.4 g/dL (ref 3.5–5.2)
Alkaline Phosphatase: 67 U/L (ref 39–117)
BUN: 12 mg/dL (ref 6–23)
CO2: 26 meq/L (ref 19–32)
Calcium: 9.9 mg/dL (ref 8.4–10.5)
Chloride: 102 meq/L (ref 96–112)
Creatinine, Ser: 0.96 mg/dL (ref 0.40–1.20)
GFR: 72.39 mL/min (ref >60.00)
Glucose, Bld: 93 mg/dL (ref 70–99)
Potassium: 4 meq/L (ref 3.5–5.1)
Sodium: 137 meq/L (ref 135–145)
Total Bilirubin: 0.6 mg/dL (ref 0.2–1.2)
Total Protein: 6.8 g/dL (ref 6.0–8.3)

## 2024-02-28 LAB — CBC
HCT: 39.6 % (ref 36.0–46.0)
Hemoglobin: 13.2 g/dL (ref 12.0–15.0)
MCHC: 33.2 g/dL (ref 30.0–36.0)
MCV: 94.2 fl (ref 78.0–100.0)
Platelets: 272 K/uL (ref 150.0–400.0)
RBC: 4.21 Mil/uL (ref 3.87–5.11)
RDW: 14 % (ref 11.5–15.5)
WBC: 5.8 K/uL (ref 4.0–10.5)

## 2024-02-28 LAB — HEMOGLOBIN A1C: Hgb A1c MFr Bld: 5.7 % (ref 4.6–6.5)

## 2024-02-28 LAB — IRON: Iron: 69 ug/dL (ref 42–145)

## 2024-02-28 LAB — TSH: TSH: 2.05 u[IU]/mL (ref 0.35–5.50)

## 2024-02-28 MED ORDER — CLONAZEPAM 1 MG PO TABS: 0.5000 mg | ORAL_TABLET | Freq: Two times a day (BID) | ORAL | 1 refills | Status: DC | PRN

## 2024-02-28 MED ORDER — COVID-19 MRNA VAC-TRIS(PFIZER) 30 MCG/0.3ML IM SUSY: 0.3000 mL | PREFILLED_SYRINGE | Freq: Once | INTRAMUSCULAR | 0 refills | Status: AC

## 2024-02-28 NOTE — Progress Notes (Signed)
 [employer] collected and ordered in portal by Edison Bunker, CMA.   Order number :[id number] [employer] Tracking: [id number] [employer] confirmation: [id number]  Sample/envelope left a front desk for [employer] pickup today 3p-4p.  Vanessa Sampson

## 2024-02-28 NOTE — Patient Instructions (Signed)
 Good to see you today!  I am sorry you are having such a hard time Flu shot today Vanessa Sampson testing in process- we will know more about the best anxiety and depression meds for you once this comes back For now continue fluoxetine / wellbutrin  and we will add clonazepam .  Start with a 1/2 tablet twice daily.  Can go up to a whole tablet if needed  I will be in touch with your labs asap Seeing a counselor is a great idea!    I will also send an rx for a covid booster if you wish to have one this fall/ winter season

## 2024-03-06 ENCOUNTER — Other Ambulatory Visit: Payer: Self-pay | Admitting: Family Medicine

## 2024-03-06 ENCOUNTER — Other Ambulatory Visit (HOSPITAL_BASED_OUTPATIENT_CLINIC_OR_DEPARTMENT_OTHER): Payer: Self-pay

## 2024-03-06 DIAGNOSIS — F419 Anxiety disorder, unspecified: Secondary | ICD-10-CM

## 2024-03-07 ENCOUNTER — Encounter: Payer: Self-pay | Admitting: Family Medicine

## 2024-03-08 MED ORDER — DESVENLAFAXINE SUCCINATE ER 50 MG PO TB24
50.0000 mg | ORAL_TABLET | Freq: Every day | ORAL | 3 refills | Status: AC
Start: 2024-03-08 — End: ?

## 2024-03-08 MED ORDER — BUPROPION HCL ER (XL) 150 MG PO TB24: 150.0000 mg | ORAL_TABLET | Freq: Every day | ORAL | 3 refills | Status: DC

## 2024-04-23 ENCOUNTER — Encounter: Payer: Self-pay | Admitting: Family Medicine

## 2024-04-28 ENCOUNTER — Other Ambulatory Visit: Payer: Self-pay | Admitting: Family Medicine

## 2024-04-28 DIAGNOSIS — F32A Depression, unspecified: Secondary | ICD-10-CM

## 2024-04-28 NOTE — Telephone Encounter (Signed)
 Requesting: Clonazepam  1 MG  Contract: N/A UDS: N/A Last Visit: 02/28/2024 Next Visit: N/A Last Refill: 02/28/2024  Please Advise

## 2024-05-23 ENCOUNTER — Encounter: Payer: Self-pay | Admitting: Family Medicine

## 2024-05-23 DIAGNOSIS — F419 Anxiety disorder, unspecified: Secondary | ICD-10-CM

## 2024-05-23 MED ORDER — BUPROPION HCL ER (XL) 300 MG PO TB24: 300.0000 mg | ORAL_TABLET | Freq: Every day | ORAL | 3 refills | Status: AC

## 2024-05-23 NOTE — Addendum Note (Signed)
 Addended by: WATT RAISIN C on: 05/23/2024 04:33 PM   Modules accepted: Orders

## 2024-05-25 ENCOUNTER — Other Ambulatory Visit: Payer: Self-pay | Admitting: Family Medicine

## 2024-06-19 ENCOUNTER — Other Ambulatory Visit: Payer: Self-pay | Admitting: Family Medicine

## 2024-06-19 DIAGNOSIS — F410 Panic disorder [episodic paroxysmal anxiety] without agoraphobia: Secondary | ICD-10-CM

## 2024-06-21 ENCOUNTER — Other Ambulatory Visit: Payer: Self-pay | Admitting: Family Medicine

## 2024-06-21 DIAGNOSIS — F419 Anxiety disorder, unspecified: Secondary | ICD-10-CM

## 2024-06-22 MED ORDER — CLONAZEPAM 1 MG PO TABS: ORAL_TABLET | ORAL | 2 refills | Status: AC

## 2024-06-22 NOTE — Addendum Note (Signed)
 Addended by: WATT RAISIN C on: 06/22/2024 05:42 AM   Modules accepted: Orders

## 2024-07-16 ENCOUNTER — Encounter: Payer: Self-pay | Admitting: Family Medicine
